# Patient Record
Sex: Male | Born: 1945 | Race: White | Hispanic: No | Marital: Married | Smoking: Former smoker
Health system: Southern US, Community
[De-identification: ages and names within clinical notes are randomized; demographics above are authoritative.]

## PROBLEM LIST (undated history)

## (undated) DIAGNOSIS — Z789 Other specified health status: Secondary | ICD-10-CM

## (undated) DIAGNOSIS — A630 Anogenital (venereal) warts: Secondary | ICD-10-CM

## (undated) HISTORY — DX: Anogenital (venereal) warts: A63.0

## (undated) HISTORY — PX: BACK SURGERY: SHX140

## (undated) HISTORY — PX: VEIN SURGERY: SHX48

## (undated) HISTORY — PX: HERNIA REPAIR: SHX51

---
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
## 1998-07-17 ENCOUNTER — Encounter: Payer: Self-pay | Admitting: Neurological Surgery

## 1998-07-17 ENCOUNTER — Ambulatory Visit (HOSPITAL_COMMUNITY): Admission: RE | Admit: 1998-07-17 | Discharge: 1998-07-17 | Payer: Self-pay | Admitting: Neurological Surgery

## 1998-07-25 ENCOUNTER — Encounter: Payer: Self-pay | Admitting: Neurological Surgery

## 1998-07-26 ENCOUNTER — Inpatient Hospital Stay (HOSPITAL_COMMUNITY): Admission: RE | Admit: 1998-07-26 | Discharge: 1998-07-28 | Payer: Self-pay | Admitting: Neurological Surgery

## 1998-07-26 ENCOUNTER — Encounter: Payer: Self-pay | Admitting: Neurological Surgery

## 1998-09-11 ENCOUNTER — Encounter: Payer: Self-pay | Admitting: Neurological Surgery

## 1998-09-11 ENCOUNTER — Ambulatory Visit (HOSPITAL_COMMUNITY): Admission: RE | Admit: 1998-09-11 | Discharge: 1998-09-11 | Payer: Self-pay | Admitting: Neurological Surgery

## 1999-07-21 ENCOUNTER — Ambulatory Visit (HOSPITAL_COMMUNITY): Admission: RE | Admit: 1999-07-21 | Discharge: 1999-07-21 | Payer: Self-pay | Admitting: Neurological Surgery

## 1999-07-21 ENCOUNTER — Encounter: Payer: Self-pay | Admitting: Neurological Surgery

## 2000-01-14 ENCOUNTER — Inpatient Hospital Stay (HOSPITAL_COMMUNITY): Admission: EM | Admit: 2000-01-14 | Discharge: 2000-01-17 | Payer: Self-pay | Admitting: Emergency Medicine

## 2000-01-15 ENCOUNTER — Encounter: Payer: Self-pay | Admitting: Internal Medicine

## 2000-09-25 ENCOUNTER — Encounter: Payer: Self-pay | Admitting: Neurological Surgery

## 2000-09-25 ENCOUNTER — Encounter: Admission: RE | Admit: 2000-09-25 | Discharge: 2000-09-25 | Payer: Self-pay | Admitting: Neurological Surgery

## 2002-09-06 ENCOUNTER — Encounter: Payer: Self-pay | Admitting: Oncology

## 2002-09-06 ENCOUNTER — Ambulatory Visit (HOSPITAL_COMMUNITY): Admission: RE | Admit: 2002-09-06 | Discharge: 2002-09-06 | Payer: Self-pay | Admitting: Oncology

## 2004-09-06 ENCOUNTER — Ambulatory Visit: Payer: Self-pay | Admitting: Oncology

## 2005-09-05 ENCOUNTER — Ambulatory Visit: Payer: Self-pay | Admitting: Oncology

## 2006-09-16 ENCOUNTER — Ambulatory Visit: Payer: Self-pay | Admitting: Oncology

## 2006-09-21 LAB — CBC WITH DIFFERENTIAL/PLATELET
BASO%: 0.9 % (ref 0.0–2.0)
EOS%: 1.2 % (ref 0.0–7.0)
HCT: 41.3 % (ref 38.7–49.9)
LYMPH%: 24 % (ref 14.0–48.0)
MCH: 31.4 pg (ref 28.0–33.4)
MCHC: 34.7 g/dL (ref 32.0–35.9)
NEUT%: 63.7 % (ref 40.0–75.0)
Platelets: 185 10*3/uL (ref 145–400)
RBC: 4.56 10*6/uL (ref 4.20–5.71)
WBC: 6.4 10*3/uL (ref 4.0–10.0)

## 2006-09-21 LAB — PSA: PSA: 0.78 ng/mL (ref 0.10–4.00)

## 2007-09-15 ENCOUNTER — Ambulatory Visit: Payer: Self-pay | Admitting: Oncology

## 2007-11-19 ENCOUNTER — Emergency Department (HOSPITAL_COMMUNITY): Admission: EM | Admit: 2007-11-19 | Discharge: 2007-11-19 | Payer: Self-pay | Admitting: Emergency Medicine

## 2007-11-21 ENCOUNTER — Emergency Department (HOSPITAL_COMMUNITY): Admission: EM | Admit: 2007-11-21 | Discharge: 2007-11-22 | Payer: Self-pay | Admitting: Emergency Medicine

## 2007-12-07 ENCOUNTER — Ambulatory Visit: Payer: Self-pay | Admitting: Oncology

## 2007-12-07 ENCOUNTER — Encounter: Admission: RE | Admit: 2007-12-07 | Discharge: 2007-12-07 | Payer: Self-pay | Admitting: Neurological Surgery

## 2007-12-07 LAB — CBC WITH DIFFERENTIAL/PLATELET
EOS%: 0.8 % (ref 0.0–7.0)
Eosinophils Absolute: 0.1 10*3/uL (ref 0.0–0.5)
LYMPH%: 15.8 % (ref 14.0–48.0)
MCH: 31.3 pg (ref 28.0–33.4)
MCV: 90.8 fL (ref 81.6–98.0)
MONO%: 6.3 % (ref 0.0–13.0)
NEUT#: 9.2 10*3/uL — ABNORMAL HIGH (ref 1.5–6.5)
Platelets: 159 10*3/uL (ref 145–400)
RBC: 4.83 10*6/uL (ref 4.20–5.71)
RDW: 15.8 % — ABNORMAL HIGH (ref 11.2–14.6)

## 2007-12-07 LAB — PSA: PSA: 1.03 ng/mL (ref 0.10–4.00)

## 2007-12-16 ENCOUNTER — Ambulatory Visit (HOSPITAL_COMMUNITY): Admission: RE | Admit: 2007-12-16 | Discharge: 2007-12-16 | Payer: Self-pay | Admitting: Neurological Surgery

## 2008-01-13 ENCOUNTER — Inpatient Hospital Stay (HOSPITAL_COMMUNITY): Admission: RE | Admit: 2008-01-13 | Discharge: 2008-01-22 | Payer: Self-pay | Admitting: Neurological Surgery

## 2008-05-24 ENCOUNTER — Encounter: Admission: RE | Admit: 2008-05-24 | Discharge: 2008-05-24 | Payer: Self-pay | Admitting: Neurological Surgery

## 2008-11-20 ENCOUNTER — Ambulatory Visit (HOSPITAL_COMMUNITY): Admission: RE | Admit: 2008-11-20 | Discharge: 2008-11-20 | Payer: Self-pay | Admitting: Neurological Surgery

## 2008-12-01 ENCOUNTER — Ambulatory Visit: Payer: Self-pay | Admitting: Oncology

## 2010-02-04 IMAGING — CT CT L SPINE W/ CM
3 of 15 series · 7 of 33 positions shown, 8 images · IV contrast (omnipaque)
Comparison: MRI lumbar spine 12/07/2007
COMPARISON: Lumbar MRI 12/07/2007

CLINICAL DATA: Low back pain with leg pain.

]
MYELOGRAM LUMBAR
TECHNIQUE: ] Following injection of intrathecal Omnipaque contrast,
spine imaging in multiple projections was performed using
fluoroscopy.
TECHNIQUE: CT imaging of the lumbar spine was performed after
intrathecal contrast administration.  Multiplanar CT image
reconstructions were also generated.

[Series 2: l-spine helical · axial · 0.27mm/px · z∈[-406,-173]mm · 2 of 94 slices shown, 3 images]
[im 1/94  soft-tissue]
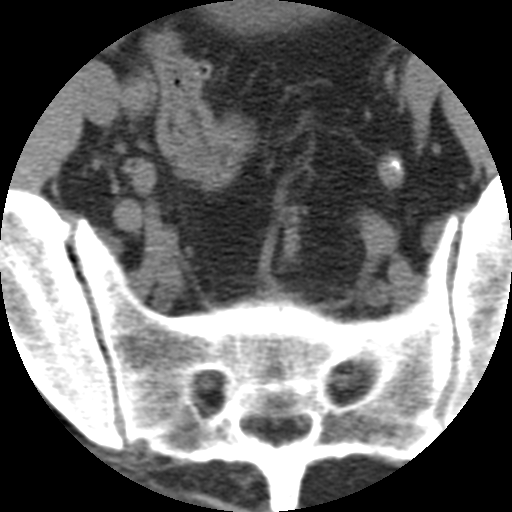
[im 1/94  bone]
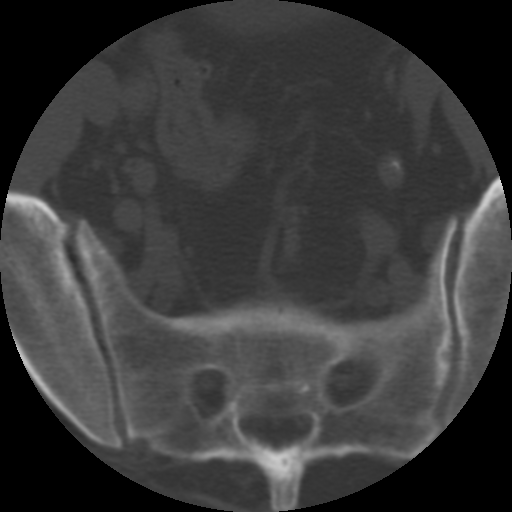
[im 94/94  bone]
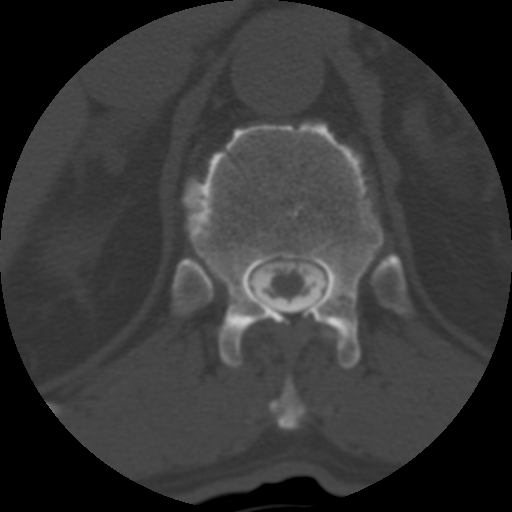

[Series 103: reformatted · sagittal · 0.45mm/px · 2 of 56 slices shown (1 of 2)]
[im 19/56  bone]
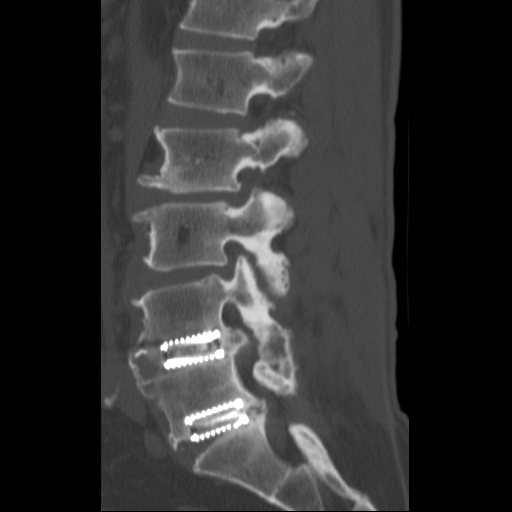
[im 37/56  bone]
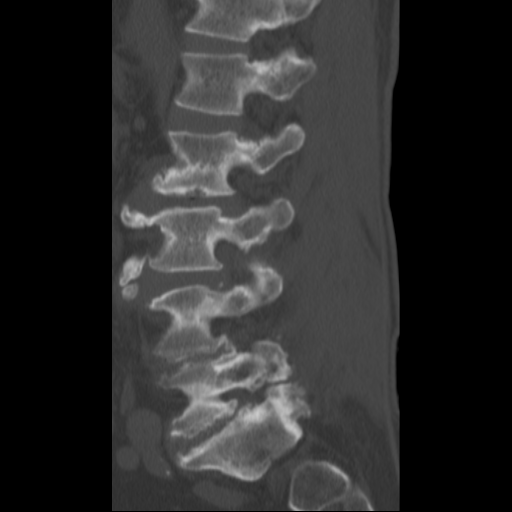

[Series 105: reformatted · coronal · 0.47mm/px · 3 of 62 slices shown (2 of 2)]
[im 13/62  bone]
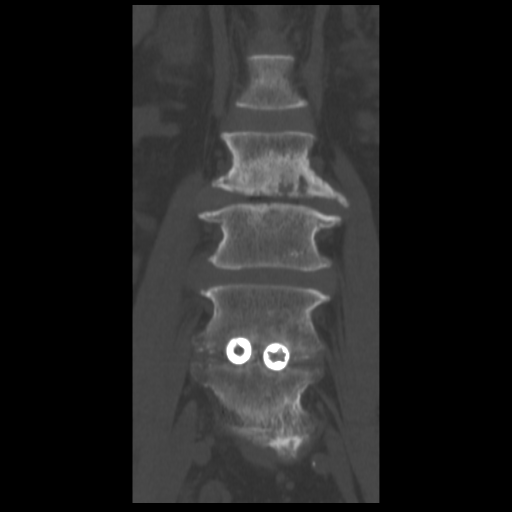
[im 25/62  bone]
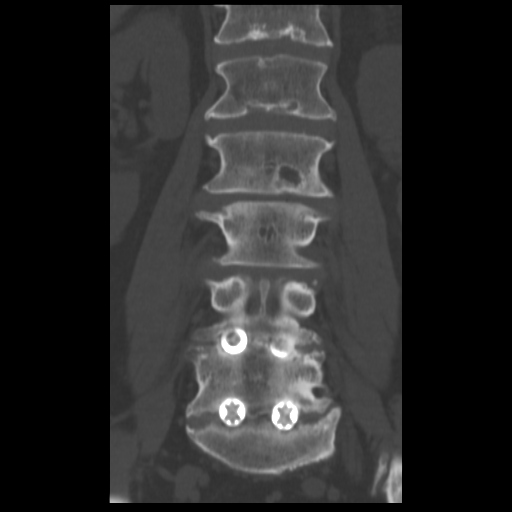
[im 37/62  bone]
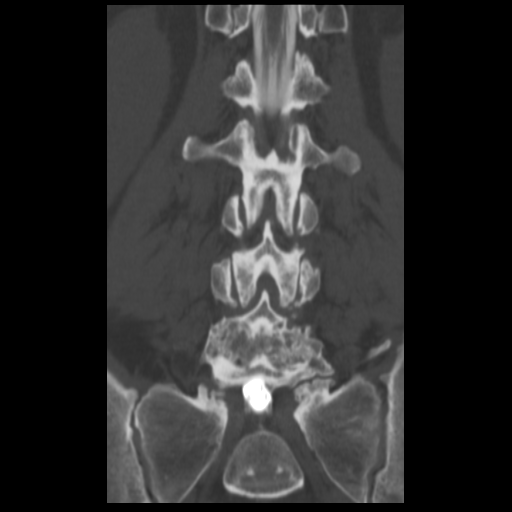

[7 of 33 positions shown; findings below may reference images not displayed]

FINDINGS: Lumbar puncture injection of Omnipaque contrast was
performed by Dr. Escobar.  There has been Ray cage fusion at L4-5
and L5, S1.

There is moderate spinal stenosis at L2-3 with disc protrusion and
vertebral endplate spurring.

There is severe spinal stenosis at L3-4 with retrolisthesis of L3-
L4 vertebral spurring.

There is no significant spinal stenosis at L4-5 or L5-S1.  Ray
cages are in good position.  No vertebral body fractures present.
IMPRESSION: Satisfactory bilateral Ray cage fusion at L4-5 and L5-S1

Moderate spinal stenosis at L2-3 and severe spinal stenosis at L3-
4.

CT MYELOGRAPHY LUMBAR SPINE
FINDINGS: There is mild retrolisthesis of L3 and L4 measuring
approximate 3 mm.  No fracture is identified.  The conus medullaris
is normal.

T12-L1 mild disc bulging

L1-2:  Mild to moderate disc bulging and mild facet arthropathy.
There is mild spinal stenosis.

L2-3:  There is advanced disc degeneration with disc space
narrowing and diffuse bulging of the disc.  Extensive endplate
degenerative change is seen with subchondral cysts and sclerosis in
the vertebral bodies.  There is moderate facet and ligamentum
flavum hypertrophy and there is moderate to severe spinal stenosis.
Lateral recesses are narrowed bilaterally

L3-4:  There is diffuse bulging of the disc.  There is moderate to
advanced facet and ligamentum flavum hypertrophy.  There is severe
spinal stenosis and there is significant lateral recess stenosis
bilaterally.

L4-5:  Bilateral ray cages are in good position.  This appears to
be a solid bony fusion.  No significant spinal stenosis.

L5-S1:  Bilateral ray cages are in good position.  There is
pseudoarthrosis at L5-S1 no significant spinal stenosis is
identified.
IMPRESSION: Moderately severe spinal stenosis at L2-3 and severe spinal
stenosis at L3-4.

Ray cage fusion at L4-5 without spinal stenosis

Ray cage fusion at L5-S1 with pseudoarthrosis.  No significant
spinal stenosis.

## 2010-11-06 LAB — CBC
HCT: 40.3 % (ref 39.0–52.0)
Hemoglobin: 13.6 g/dL (ref 13.0–17.0)
MCHC: 33.6 g/dL (ref 30.0–36.0)
MCV: 86.7 fL (ref 78.0–100.0)
RBC: 4.65 MIL/uL (ref 4.22–5.81)
RDW: 21.8 % — ABNORMAL HIGH (ref 11.5–15.5)

## 2010-12-10 NOTE — Discharge Summary (Signed)
NAME:  Grant Clark, Grant Clark              ACCOUNT NO.:  [id number]   MEDICAL RECORD NO.:  [id number]          PATIENT TYPE:  AMB   LOCATION:  SDS                          FACILITY:  [facility]   PHYSICIAN:  Stefani Dama, M.D.  DATE OF BIRTH:  05/05/1946   DATE OF ADMISSION:  01/13/2008  DATE OF DISCHARGE:  01/22/2008                               DISCHARGE SUMMARY   ADMITTING DIAGNOSIS:  Lumbar spondylosis with stenosis L2-L3 and L3-L4,  status post arthrodesis L4 through sacrum.   DISCHARGE DIAGNOSIS:  Lumbar spondylosis with stenosis L2-L3 and L3-L4,  status post arthrodesis L4 though sacrum.   MAJOR OPERATION:  Decompression L2-L3 and L3-L4 with total laminectomy,  bilateral diskectomy L2-L3 and L3-L4, posterior lumbar interbody fusion,  and fixation L2 to L4 with pedicle screws on January 13, 2008 and then  revision of lumbar fusion on January 18, 2008, revision surgery on January 18, 2008 secondary to loss of fixation of L4 with fixation from L2 to L5.   HOSPITAL COURSE:  [name] is a 65 year old individual  who has had significant problems with back pain, bilateral lower  extremity pain, lumbar spinal stenosis.  He has advanced stenotic  disease at L2-L3 and L3-L4.  He was taken to the operating room on January 13, 2008, where he underwent surgical decompression at L2-L3 and L3-L4  with posterior lumbar interbody arthrodesis is being performed along  with segmental fixation.  The patient did well until that evening of  28th when he developed significant pain in his back the next day he was  evaluated with a CT scan of lumbar spine which demonstrated that he had  a fracture through the pedicle bilaterally at the L4 vertebrae.  I was  felt that he had loss of fixation at the L4 vertebrae and the following  day, he was taken to the operating room and underwent surgical revision.  The fracture was noted and screws were removed particularly from the  right side where he had worse  of his pain and fixation was then  performed down to L5.  The patient has done well since that time and has  had progressive diminish in his degree of pain.  His incision is clean  and dry and at the current time the patient is ambulatory.  He did have  some postoperative constipation this has been resolved with use of  laxatives.   CONDITION ON DISCHARGE:  Improving.   DISCHARGE MEDICATIONS:  The patient was given prescriptions for Percocet  #60 without refills, Valium 5 mg #40 without refills, and Robaxin #40  with 3 refills.  He will be seen in the office in 3 weeks' time.      Stefani Dama, M.D.  Electronically Signed     HJE/MEDQ  D:  01/21/2008  T:  01/22/2008  Job:  [id number]

## 2010-12-10 NOTE — Op Note (Signed)
NAME:  LATHEN, SEAL NO.:  [id number]   MEDICAL RECORD NO.:  [id number]          PATIENT TYPE:  INP   LOCATION:  3104                         FACILITY:  [facility]   PHYSICIAN:  Stefani Dama, M.D.  DATE OF BIRTH:  Feb 14, 1946   DATE OF PROCEDURE:  01/13/2008  DATE OF DISCHARGE:                               OPERATIVE REPORT   PREOPERATIVE DIAGNOSES:  1. Lumbar spondylosis and stenosis, L2-3 and L3-4, with lumbar      radiculopathy.  2. Neurogenic claudication.  3. Status post arthrodesis, L4 to sacrum.   POSTOPERATIVE DIAGNOSES:  1. Lumbar spondylosis and stenosis, L2-3 and L3-4 with lumbar      radiculopathy.  2. Neurogenic claudication.  3. Status post arthrodesis, L4 to sacrum.   PROCEDURES:  1. Laminotomies bilaterally at L2-3 and L3-4 with decompression of L2,      L3, and L4 nerve roots bilaterally.  2. Posterior lumbar interbody fusion with PEEK bone spacers and local      autograft and allograft.  3. Total diskectomy, L2-3 and L3-4.  4. Segmental fixation, L2 to L4.  5. Posterolateral arthrodesis with local autograft and allograft.   SURGEON:  Stefani Dama, MD   FIRST ASSISTANT:  Danae Orleans. Venetia Maxon, MD   ANESTHESIA:  General endotracheal.   INDICATIONS:  Dakari Cregger is a 65 year old individual who 12  years ago underwent surgical decompression and arthrodesis at L4-5 and  L5-S1 for severe spondylitic stenosis.  He did well for a long number of  years, but in the past year he has developed recurrent symptoms of  neurogenic claudication with lumbar radiculopathy.  He is now found to  have severe spondylitic stenosis at L2-3 and L3-4.  He has been advised  regarding surgical decompression and fusion.   PROCEDURE IN DETAIL:  The patient was brought to the operating room  supine on the stretcher.  After smooth induction of general endotracheal  anesthesia, he was turned prone.  The back was prepped with alcohol and  DuraPrep and draped  in a sterile fashion.  Midline incision was created  and carried down to the lumbodorsal fascia which was opened on either  side of the midline to expose the spinous processes of L2, L3, and L4,  which were identified positively with a radiograph.  Then, the  interlaminar spaces at L2-3 and L3-4 were cleared, and a large  laminotomy involving the inferior marginal of the lamina all the way up  to and including the entirety of the facet joint at L2 and L3 was  performed; this was performed bilaterally.  Then, the yellow ligament,  which was thickened and redundant, was taken up and a medial facetectomy  was also performed to expose the entirety of the common dural tube up to  and including the takeoff of the L2 nerve root superiorly and the L3  nerve root inferiorly at the L2-3 level and then at the L3-4 level the  L3 and L4 nerve roots were completely isolated and skeletonized and  decompressed.  Once this was accomplished, bilateral diskectomies were  then performed at L2-3  and L3-4 for using a combination of curettes and  rongeurs.  The entirety of the disk space was evacuated fully and then  the endplates were curetted smooth until good bony bleeding was  encountered.  The interbody spacers were then sized.  It was felt that  10-mm spacers would fit well at L3-4 and 8-mm spacers would fit well at  L2-3.  Spacers were then prepared by filling them with autograft,  allograft, and strips of Infuse.  Each interspace was then packed with  autograft, allograft, and strips of Infuse also and then the cages were  placed at L3-4 first and then at L2-3.  Once these were secured, lateral  gutters were then decompressed and skeletonized for posterolateral  grafting.  Then with fluoroscopic guidance, pedicle screws were placed  at L2, L3, and L4, each screw being placed after a probe was passed  using fluoroscopic guidance, removing the probe, tapping each hole, and  then sounding the hole to make  sure there was no lateral cutout.  The  screws used were 6.5 x 45 mm screws throughout.  Once the screws were  placed and their positions verified in AP and lateral projections with  fluoroscopy, fluoro unit was removed and 70-mm precontoured rods were  used to connect between the pedicle screws from L2 to L4.  These were  torqued down in a neutral position.  Posterolateral graft was then  packed into the lateral gutters between L2 and L4.  Once this was  accomplished, the retractors were removed.  The area was inspected.  Care was taken to make sure that the L2, L3, and the L4 nerve roots were  well decompressed as had originally being accomplished, and once this  was finished then the lumbodorsal fascia was reapproximated with #1  Vicryl in interrupted fashion, 2-0 Vicryl in subcutaneous tissues, and 3-  0 Vicryl subcuticularly.  Dry sterile dressing was placed on the skin.  Blood loss was estimated at 750 mL, and 250 mL of Cell Saver blood was  returned to the patient.  The patient tolerated the procedure well.      Stefani Dama, M.D.  Electronically Signed     HJE/MEDQ  D:  01/13/2008  T:  01/14/2008  Job:  [id number]

## 2010-12-10 NOTE — Op Note (Signed)
NAME:  Grant Clark, Grant Clark              ACCOUNT NO.:  [id number]   MEDICAL RECORD NO.:  [id number]          PATIENT TYPE:  OIB   LOCATION:  3524                         FACILITY:  [facility]   PHYSICIAN:  Stefani Dama, M.D.  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   POSTOPERATIVE DIAGNOSIS:  Right ulnar neuropathy secondary to  compression at the elbow and Guyon canal.   OPERATION:  Right ulnar nerve decompression at the cubital tunnel and  right ulnar nerve decompression at Guyon canal.   SURGEON:  Stefani Dama, MD   ANESTHESIA:  General with lidocaine 1% with epinephrine mixed 50:50 with  0.50% Marcaine for a total of 20 mL.   INDICATIONS:  Grant Clark is a 65 year old individual who has had progressive  loss of ulnar functioning muscles with weakness and atrophy in the  intrinsic muscles of the hand.  He has pain in the ulnar distribution.  Efforts to treat this conservatively with some splinting and the passage  of time and some behavioral changes with the use of the right elbow have  not yielded any significant relief.  Electrical studies demonstrate  clearly that he has a marked swelling across the region of the elbow but  he has a second area of swelling through the region of Guyon canal is  though undergoing decompression of the ulnar nerve.   PROCEDURE:  The patient was brought to the operating room and placed on  op table in supine position.  After a smooth induction of general  endotracheal anesthesia, the right hand, forearm and arm was prepped  with alcohol and DuraPrep and draped in a sterile fashion using a  stockinette technique.  Stockinette was opened over the area of the  wrist and over the area of the elbow.  The planned surgical sites were  then drawn out with a  Z-plasty type incision over the area of the wrist.  This area was infiltrated with 10 mL of lidocaine mixed with 0.50%  Marcaine and 1:200,000 epinephrine.  The incision across the elbow was  infiltrated with a similar mixture for a total volume of 20 mL between  both incision areas.  The wrist was opened first with the incision being  opened in Z fashion across the wrist crease.  Dissection was taken down  through the superficial tissues.  The lateral aspect of the flexor carpi  ulnaris tendon was identified and medial to this and the groove between  the pisiform and the hamate was identified the ulnar nerve along with  the ulnar artery.  The ulnar nerve was noted to have a tight syncytium  tissue just above the right knee area of the groove between the hamate  and the pisiform bone.  This area was dissected free and the nerve was  freed.  The distal branch traveling deep into the palmar fascia which  was the motor branch was then decompressed in  a similar fashion.  Minimal bleeding was encountered during this dissection.  Several small  vessels were cauterized with the microbipolar cautery.  The proximal  aspect of the nerve across the wrist crease itself did not appear to be  very compressed.  The superficial fascia over this was released to allow  good visualization of the nerve.  Once the decompression was completed  two 3-0 Vicryl sutures were used on the fascia to reapproximate the skin  and then interrupted vertical mattress nylon sutures were placed to  close the skin.  A dry, fluffy dressing was applied to this area.  Second portion of the procedure was then started by opening a 10-cm  incision just ventral to the medial aspect of the elbow.  This was  dissected down through the superficial fascia and then in the ulnar  groove.  Thickened dense fascia was opened proximally to identify the  incoming ulnar nerve from between the muscular groove.  The nerve was  identified and then  the distal fascia was dissected to decompress the  nerve as it traveled across the cubital tunnel and then distally there  was noted to be a thick  syncytium of tissue just as the nerve entered  between the two heads of the flexor carpi ulnaris.  This fascia was  opened and allowed for good decompression of the nerve.  This was a most  significant area of compromise for the nerve itself.  Once this area was  completely decompressed and the decompression was obtained in three-  quarter circular fashion around the nerve it was decided that no further  manipulation of the tissues would be required.  Hemostasis in the  muscular branches to the flexor carpi ulnaris muscle was obtained  meticulously.  Once this was achieved and several subcutaneous fascial  sutures reapproximated the skin and a 4-0 Monocryl suture was used to  close the subcuticular skin.  Dry sterile dressings were then applied.  The ulnar incision was closed with a Dermabond layer and then a fluffy  dressing was placed over the elbow and over the wrist.  The patient  tolerated the procedure well.  Blood loss was nil.      Stefani Dama, M.D.  Electronically Signed     HJE/MEDQ  D:  11/20/2008  T:  11/20/2008  Job:  [id number]

## 2010-12-10 NOTE — Op Note (Signed)
NAME:  [name], [name] NO.:  [id number]   MEDICAL RECORD NO.:  [id number]          PATIENT TYPE:  INP   LOCATION:  3113                         FACILITY:  [facility]   PHYSICIAN:  Stefani Dama, M.D.  DATE OF BIRTH:  1945-11-09   DATE OF PROCEDURE:  01/18/2008  DATE OF DISCHARGE:                               OPERATIVE REPORT   PREOPERATIVE DIAGNOSES:  1. Loss of fixation L2-L4 fusion with pedicle fracture of L4.  2. Right lower extremity pain.  3. Right lower extremity weakness.   POSTOPERATIVE DIAGNOSES:  1. Loss of fixation L2-L4 fusion with pedicle fracture of L4.  2. Right lower extremity pain.  3. Right lower extremity weakness.   PROCEDURE FOR SURGERY:  1. Revision of arthrodesis with fixation L2-L5.  2. Decompression of L2, L3, and L4 nerve roots on the right.   SURGEON:  Stefani Dama, MD   ANESTHESIA:  General endotracheal.   INDICATIONS:  Grant Clark is a 65 year old individual who on the 18th  underwent surgical decompression arthrodesis of L2-L4.  He did well for  the first 3 days postoperatively but then rather acutely.  On the  evening of the 21st, he developed substantial back and lower extremity  pain.  He was complaining bitterly of pain in the region of the right  buttock and right hip and was found on the CT scan performed on the 22nd  to have fractured pedicle on the right side quite severely and on the  left side modestly with the fracture and he was found also to have some  question of extruded graft in the site of L2-L3 after careful  consideration of in fact surgical root revision of this process.   PROCEDURE:  The patient was brought to the operating room supine on the  stretcher. After smooth induction of general endotracheal anesthesia,  Foley catheter was placed.  He was turned in a prone position.  The back  was prepped with alcohol and DuraPrep and draped in a sterile fashion.  A firmly close midline incision was reopened  and sutures were carefully  removed.  The wound was reopened very carefully.  Some epidural blood  was encountered and the fascia was opened and epidural blood was  encountered in the deep portion of the wound.  Sample of this was sent  for stat Gram stain and also culture.  The area was then evaluated and  some extruded bone material was noted on the lateral gutters and this  was really quite minimal.  The L2 and the L3 nerve roots were  decompressed with some epidural clot and the L4 nerve root was similarly  found to be modestly involve.  There was noted be an obvious fracture of  the L4 pedicle on the right.  Screw heads were loosened on all six  screws and the rods were removed.  Each of the screws was then felt to  be tight and clearly the right-sided L4 screw was loose.  The screw was  removed.  The left-sided L4 screw was felt to be tight.  At this point,  it  was decided that ultimately we need fixation at L5 and then a right-  sided screw was placed in L5 measuring 6.5 x 45 mm.  After tightening  the right side including some compression, left side was reassessed and  was felt that left-sided L5 screw should also be placed.  L4 screw was  left in place using fluoroscopic guidance and the L5 screw was placed  and this was tapped to 6.5 mm tap and 6.5 x 45 mm screw was placed in  the vertebral body.  On the right side, a 100-mm rod was used.  On the  left side, a 90-mm rod was used.  These were tightened down to the final  locking position and AP and lateral radiograph of the pain to check  position of the hardware.  This was found to be adequate.  Care was  taken to make sure that hemostasis was obtained in the all lateral  gutters.  The construct was closed in a little bit of compression on the  inferior aspect of the screws and with this the area was irrigated  copiously with antibiotic irrigating solution.  Lumbodorsal fascia was  closed with #1 Vicryl in interrupted fashion,  2-0 Vicryl.  The  subcutaneous tissues and 3-0 Vicryl subcuticularly.  The patient  tolerated the procedure well and was returned to recovery room in stable  condition.  Blood loss estimated 150 mL.      Stefani Dama, M.D.  Electronically Signed     HJE/MEDQ  D:  01/18/2008  T:  01/20/2008  Job:  [id number]

## 2010-12-13 NOTE — Consult Note (Signed)
[place]  Patient:    Grant Clark, Grant Clark                     MRN: [id number] Proc. Date: 01/15/00 Adm. Date:  [id number] Disc. Date: [id number] Attending:  Phifer, Harriett Sine Welcome Dictator:   Larey Dresser, N.P. CC:         Dolan Amen, M.D.             Eric A. Donnald Garre, M.D.                          Consultation Report  REASON FOR CONSULT:  Pancytopenia.  HISTORY OF PRESENT ILLNESS:  Mr. Grant Clark is a 65 year old male who was transferred from [facility] from [facility] in [place] on January 14, 2000, after he was found to be pancytopenic upon workup for a syncopal episode at work on January 13, 2000.  The [place] in [place], but was working in [place] at the time of the syncopal episode.  His lab data at [facility] on January 13, 2000, revealed a hemoglobin of 5.7, WBC 2.3, platelet count 37,000, MCV 133, LDH 5869, reticulocyte count 0.4.  The patient received two units of packed red cells prior to his transfer to [facility].  His lab data upon arrival at [facility] revealed a hemoglobin of 7.4, WBC 1.9, platelet count 37,000, MCV 107, LDH 4711.  The patient received an additional two units of packed red cells on January 15, 2000.  Mr. Taniguchi had complaints of weakness and diminished appetite for three to four weeks.  He denied any fevers, chills, sweats, weight loss, shortness of breath or chest pain.  No recent infections.  He does report numbness to his hands and legs extending from the waist down which he reports has been present to some degree since late 1999/early 2000; but this has progressively worsened to the point where he is experiencing great difficulty with ambulation.  PAST MEDICAL HISTORY: 1. Status post anterior cervical diskectomy and arthrodesis at C4-5, C5-02 July 1998. 2. Status post lumbar laminectomy, diskectomy, L4-5, L5-S1, November 1998.  CURRENT  MEDICATIONS:  Occasional Aleve.  ALLERGIES:  No known drug allergies.  FAMILY HISTORY:  Mother deceased at age 69 with diabetes mellitus.  Father alive, age 67, prostate cancer, B12 deficiency maintained on monthly injections.  Brother, age 73, alive and well.  Sister, age 32, alive and well. Brother, age 37, alive and well.  Brother, age 61, alive and well.  SOCIAL HISTORY:  The patient lives in [place], [place], with his wife Pattricia Boss.  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  He was previously employed with the same company in maintenance. He has one son who is 78 years old and healthy.  He has no known exposure to any chemicals, toxic substances or radiation.  He does report a positive tobacco history at less than one pack per day x 25 years - active; ETOH occasional; no illegal drug use.  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  REVIEW OF SYSTEMS:  General:  No weight loss or fatigue.  He does report being "weak."  His appetite has been diminished.  He denies any fever, chills or sweats.  He denies any pain.  He has had no headaches, vision changes or hearing problems.  He  denies any mouth sores or sore throat.  No recent upper respiratory tract infections.  He reports some limited mobility of his neck since surgery.  No swollen lymph glands.  He denies any shortness of breath, but does report a cough which is dry.  He denies hemoptysis.  He denies any chest pain.  He has had no changes in bowel habits.  No rectal bleeding, melena, abdominal pain, nausea or vomiting, abdominal fullness.  He denies any hematuria, dysuria or nocturia.  He does report some right lower extremity weakness.  He has numbness to his hands and "waist down".  He has difficulty with ambulation secondary to the numbness.  He denies any blurred or double vision.  Heme:  No bruising or easy bleeding.  PHYSICAL EXAMINATION:  VITAL  SIGNS:  Temperature 97.9, heart rate 68, respirations 20, blood pressure 109/61, oxygen saturation is 95% on two liters.  GENERAL:  This is a Caucasian male in no acute distress.  HEENT:  Normocephalic, atraumatic.  Pupils are equal, round and reactive to light; extraocular movements are intact; sclerae are anicteric; mild conjunctival pallor.  Nares are patent bilaterally.  Oropharynx is clear.  LYMPH:  No cervical, supraclavicular, axillary or inguinal adenopathy.  LUNGS:  With rales to the right base and diminished at bilateral bases.  CARDIOVASCULAR:  Regular rate and rhythm with no murmur.  EXTREMITIES:  There is trace pedal edema.  Distal pulses are intact.  Without cyanosis or clubbing.  ABDOMEN:  Soft and nontender.  Bowel sounds are active.  No masses or hepatosplenomegaly.  NEUROLOGIC:  He is alert and oriented x 3.  He does follow commands.  His muscle strength is 5/5 although slightly diminished at the right quadriceps. He does have diminished proprioception as well as vibratory sense to the lower extremities.  SKIN:  Without jaundice.  LABORATORY DATA:  On June 19, 1997, MCV 103.0, hemoglobin 16.6, platelets 179,000.  On July 25, 1998, MCV 119.3, hemoglobin 15.5, platelets 186,000. On January 13, 2000, MCV 133.2, hemoglobin 5.7, platelets 37,000, sodium 140, potassium 3.7, BUN 11, creatinine 1.0, glucose 134, total bilirubin 1.0, alkaline phosphatase 45, SGOT 95, SGPT 56, total protein 5.0, albumin 3.3, calcium 8.5, PTT 32, PT/INR 16.0/1.5.  Labs faxed from [facility] which were obtained on January 13, 2000, revealed a serum iron of 186, TIBC 231, percent saturation 80.5, vitamin B12 84, folic acid 13.1, reticulocyte count 0.4, LDH 5.69.  A review of the bone marrow biopsy revealed erythroid hyperplasia with marked nucleic/cytoplasmic, a synchronicity; giant metamyelocytes.  IMPRESSION: 1. Severe B12 deficiency with hematological and neurological  manifestation.    Suggestions:     a. Therapeutic - B12 1 mg IM daily x 7, then every week for six months,       then every month for lifetime; KCl 40 mEq p.o. b.i.d. for three to four       days very close monitoring of the serum potassium as hypokalemia is a       potential occurrence; would also give folic acid 1 mg by vein although       he is not deficient.    b. 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       Dr. Nevada Crane discussed his finding with Mr. Mchan, his wife and their       son at length.  He will follow up with the patient in his office after  discharge. DD:  01/16/00 TD:  01/16/00 Job: [id number]

## 2011-04-24 LAB — TYPE AND SCREEN
ABO/RH(D): A NEG
Antibody Screen: NEGATIVE

## 2011-04-24 LAB — POCT I-STAT 4, (NA,K, GLUC, HGB,HCT)
Hemoglobin: 12.9 — ABNORMAL LOW
Potassium: 3.7

## 2011-04-24 LAB — CBC
HCT: 35.5 — ABNORMAL LOW
Hemoglobin: 13.9
MCV: 94
MCV: 94
Platelets: 146 — ABNORMAL LOW
Platelets: 128 — ABNORMAL LOW
RBC: 4.25
RDW: 17.4 — ABNORMAL HIGH
WBC: 8.7
WBC: 13.5 — ABNORMAL HIGH
WBC: 14.4 — ABNORMAL HIGH

## 2011-04-24 LAB — BASIC METABOLIC PANEL
BUN: 14
Calcium: 9.1
Chloride: 105
Creatinine, Ser: 1.1
Creatinine, Ser: 0.98
GFR calc Af Amer: >60
GFR calc non Af Amer: >60
Glucose, Bld: 90
Potassium: 4.1
Sodium: 138

## 2011-04-24 LAB — GRAM STAIN

## 2011-04-24 LAB — COMPREHENSIVE METABOLIC PANEL
BUN: 15
CO2: 25
Chloride: 106
Creatinine, Ser: 0.88
GFR calc non Af Amer: >60
Total Bilirubin: 0.9

## 2016-08-20 ENCOUNTER — Telehealth: Payer: Self-pay

## 2016-08-20 NOTE — Telephone Encounter (Signed)
Pt's VM said that he will try to call me tomorrow morning between 9-10 AM.

## 2016-08-20 NOTE — Telephone Encounter (Signed)
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 He said that he would call back later to speak with DS. I transferred call to DS VM

## 2016-08-21 ENCOUNTER — Telehealth: Payer: Self-pay

## 2016-08-21 NOTE — Telephone Encounter (Signed)
Triaged pt today.

## 2016-08-21 NOTE — Telephone Encounter (Signed)
Gastroenterology Pre-Procedure Review  Request Date: 08/21/2016 Requesting Physician: Dr. Quintin Alto  PATIENT REVIEW QUESTIONS: The patient responded to the following health history questions as indicated:    Pt's last colonoscopy was done 09/15/2011 by Dr. Britta Mccreedy. Pt had a sigmoid colon tubular adenoma and a rectal tubular adenoma.   1. Diabetes Melitis: no 2. Joint replacements in the past 12 months: no 3. Major health problems in the past 3 months: no 4. Has an artificial valve or MVP: no 5. Has a defibrillator: no 6. Has been advised in past to take antibiotics in advance of a procedure like teeth cleaning: no 7. Family history of colon cancer: no  8. Alcohol Use: Maybe a couple of beers a week 9. History of sleep apnea: no  10. History of coronary artery or other vascular stents placed within the last 12 months: no    MEDICATIONS & ALLERGIES:    Patient reports the following regarding taking any blood thinners:   Plavix? no Aspirin? no Coumadin? no Brilinta? no Xarelto? no Eliquis? no Pradaxa? no Savaysa? no Effient? no  Patient confirms/reports the following medications:  Current Outpatient Prescriptions  Medication Sig Dispense Refill  . NON FORMULARY Fish Oil   Not sure the mg/  Takes 2 tablets daily     No current facility-administered medications for this visit.     Patient confirms/reports the following allergies:  No Known Allergies  No orders of the defined types were placed in this encounter.   AUTHORIZATION INFORMATION Primary Insurance:  ID #:  Group #:  Pre-Cert / Auth required:  Pre-Cert / Auth #:   Secondary Insurance:   ID #:   Group #:  Pre-Cert / Auth required:  Pre-Cert / Auth #:   SCHEDULE INFORMATION: Procedure has been scheduled as follows:  Date: 09/15/2016                Time:  9:30 AM Location: [facility] Short Stay  This Gastroenterology Pre-Precedure Review Form is being routed to the following provider(s): Barney Drain,  MD

## 2016-08-22 NOTE — Telephone Encounter (Signed)

## 2016-08-25 MED ORDER — NA SULFATE-K SULFATE-MG SULF 17.5-3.13-1.6 GM/177ML PO SOLN: 1.0000 | ORAL | 0 refills | Status: DC

## 2016-08-25 NOTE — Telephone Encounter (Signed)
Rx sent to the pharmacy and instructions mailed to pt.  

## 2016-08-28 DIAGNOSIS — A630 Anogenital (venereal) warts: Secondary | ICD-10-CM

## 2016-08-28 HISTORY — DX: Anogenital (venereal) warts: A63.0

## 2016-09-01 ENCOUNTER — Other Ambulatory Visit: Payer: Self-pay

## 2016-09-01 DIAGNOSIS — D126 Benign neoplasm of colon, unspecified: Secondary | ICD-10-CM

## 2016-09-01 NOTE — Telephone Encounter (Signed)
Left the message on the phone and told them to return call and let me know they got the message.

## 2016-09-01 NOTE — Telephone Encounter (Signed)
Pt received his instructions in the mail. His wife called to inform us that he DOES TAKE IRON DAILY. I told her to have him HOLD IRON FOR 7 DAYS PRIOR TO PROCEDURE. PT had only told me about the fish oil.  I have updated the chart per his wife with the OTC meds, allergy, multi vit and the iron and the Vit b12 injection.  Forwarding to Dr. Oneida Alar for [place].

## 2016-09-01 NOTE — Telephone Encounter (Signed)
PLEASE CALL PT. 7 days BEFORE ENDOSCOPY He should hold the Fish Oil as well.

## 2016-09-02 NOTE — Telephone Encounter (Signed)
Pt is aware.  

## 2016-09-08 NOTE — Telephone Encounter (Signed)
Pt's wife, Raynelle Highland, called to confirm the meds he is to hold prior to procedure. She is aware of the Iron and the Fish Oil to hold 7 days prior to procedure.

## 2016-09-11 ENCOUNTER — Telehealth: Payer: Self-pay

## 2016-09-11 NOTE — Telephone Encounter (Signed)
Per Madie Reno PA# [id number] for the colonoscopy as out patient.

## 2016-09-15 ENCOUNTER — Encounter (HOSPITAL_COMMUNITY)
Admission: RE | Disposition: A | Discharge status: Home Health | Payer: Self-pay | Source: Ambulatory Visit | Attending: Gastroenterology

## 2016-09-15 ENCOUNTER — Encounter (HOSPITAL_COMMUNITY): Payer: Self-pay

## 2016-09-15 ENCOUNTER — Ambulatory Visit (HOSPITAL_COMMUNITY)
Admission: RE | Admit: 2016-09-15 | Discharge: 2016-09-15 | Disposition: A | Discharge status: Home Health | Payer: 59 | Source: Ambulatory Visit | Attending: Gastroenterology | Admitting: Gastroenterology

## 2016-09-15 DIAGNOSIS — Z1211 Encounter for screening for malignant neoplasm of colon: Secondary | ICD-10-CM | POA: Diagnosis not present

## 2016-09-15 DIAGNOSIS — K644 Residual hemorrhoidal skin tags: Secondary | ICD-10-CM | POA: Diagnosis not present

## 2016-09-15 DIAGNOSIS — Z87891 Personal history of nicotine dependence: Secondary | ICD-10-CM | POA: Insufficient documentation

## 2016-09-15 DIAGNOSIS — Z1212 Encounter for screening for malignant neoplasm of rectum: Secondary | ICD-10-CM

## 2016-09-15 DIAGNOSIS — D490 Neoplasm of unspecified behavior of digestive system: Secondary | ICD-10-CM

## 2016-09-15 DIAGNOSIS — K5730 Diverticulosis of large intestine without perforation or abscess without bleeding: Secondary | ICD-10-CM | POA: Diagnosis not present

## 2016-09-15 DIAGNOSIS — Z79899 Other long term (current) drug therapy: Secondary | ICD-10-CM | POA: Diagnosis not present

## 2016-09-15 DIAGNOSIS — D126 Benign neoplasm of colon, unspecified: Secondary | ICD-10-CM

## 2016-09-15 DIAGNOSIS — A630 Anogenital (venereal) warts: Secondary | ICD-10-CM | POA: Diagnosis not present

## 2016-09-15 HISTORY — PX: COLONOSCOPY: SHX5424

## 2016-09-15 HISTORY — DX: Other specified health status: Z78.9

## 2016-09-15 SURGERY — COLONOSCOPY
Anesthesia: Moderate Sedation

## 2016-09-15 MED ORDER — MEPERIDINE HCL 100 MG/ML IJ SOLN
INTRAMUSCULAR | Status: DC | PRN
  Administered 2016-09-15 (×2): 25 mg via INTRAVENOUS

## 2016-09-15 MED ORDER — MIDAZOLAM HCL 5 MG/5ML IJ SOLN
INTRAMUSCULAR | Status: AC
  Filled 2016-09-15: qty 10

## 2016-09-15 MED ORDER — STERILE WATER FOR IRRIGATION IR SOLN
Status: DC | PRN
  Administered 2016-09-15: 100 mL

## 2016-09-15 MED ORDER — SODIUM CHLORIDE 0.9 % IV SOLN
INTRAVENOUS | Status: DC
  Administered 2016-09-15: 09:00:00 via INTRAVENOUS

## 2016-09-15 MED ORDER — MIDAZOLAM HCL 5 MG/5ML IJ SOLN
INTRAMUSCULAR | Status: DC | PRN
  Administered 2016-09-15 (×2): 2 mg via INTRAVENOUS

## 2016-09-15 MED ORDER — MEPERIDINE HCL 100 MG/ML IJ SOLN
INTRAMUSCULAR | Status: AC
  Filled 2016-09-15: qty 2

## 2016-09-15 NOTE — H&P (Signed)
Primary Care Physician:  Manon Hilding, MD Primary Gastroenterologist:  Dr. Oneida Alar  Pre-Procedure History & Physical: HPI:  Grant Clark is a 71 y.o. male here for COLON CANCER SCREENING.  Past Medical History:  Diagnosis Date  . Medical history non-contributory     Past Surgical History:  Procedure Laterality Date  . APPENDECTOMY  1963   [facility]  . BACK SURGERY     times 5, [facility]  . HERNIA REPAIR     inguinal hernia, 5 or 6 years back  . VEIN SURGERY Right    [place]    Prior to Admission medications   Medication Sig Start Date End Date Taking? Authorizing Provider  cetirizine (ZYRTEC) 10 MG tablet Take 10 mg by mouth daily.   Yes Historical Provider, MD  Cyanocobalamin (B-12 COMPLIANCE INJECTION) 1000 MCG/ML KIT Inject as directed every 30 (thirty) days.   Yes Historical Provider, MD  ferrous sulfate 325 (65 FE) MG EC tablet Take 325 mg by mouth daily with breakfast.   Yes Historical Provider, MD  Multiple Vitamin (MULTIVITAMIN) tablet Take 1 tablet by mouth daily.   Yes Historical Provider, MD  Na Sulfate-K Sulfate-Mg Sulf (SUPREP BOWEL PREP KIT) 17.5-3.13-1.6 GM/180ML SOLN Take 1 kit by mouth as directed. 08/25/16  Yes Danie Binder, MD  Omega-3 Fatty Acids (FISH OIL) 1000 MG CAPS Take 2 capsules by mouth daily.   Yes Historical Provider, MD    Allergies as of 09/01/2016  . (No Known Allergies)    Family history: no colonCANCER/polyps  Social History   Social History  . Marital status: Married    Spouse name: N/A  . Number of children: N/A  . Years of education: N/A   Occupational History  . Not on file.   Social History Main Topics  . Smoking status: Former Research scientist (life sciences)  . Smokeless tobacco: Never Used  . Alcohol use Yes     Comment: 2 more than 2 a week  . Drug use: No  . Sexual activity: Not on file   Other Topics Concern  . Not on file   Social History Narrative  . No narrative on file    Review of Systems: See HPI, otherwise negative  ROS   Physical Exam: BP 139/79   Pulse 62   Temp 98.2 F (36.8 C) (Oral)   Resp 12   Ht 5' 11"  (1.803 m)   Wt 192 lb (87.1 kg)   SpO2 98%   BMI 26.78 kg/m  General:   Alert,  pleasant and cooperative in NAD Head:  Normocephalic and atraumatic. Neck:  Supple; Lungs:  Clear throughout to auscultation.    Heart:  Regular rate and rhythm. Abdomen:  Soft, nontender and nondistended. Normal bowel sounds, without guarding, and without rebound.   Neurologic:  Alert and  oriented x4;  grossly normal neurologically.  Impression/Plan:     SCREENING  Plan:  1. TCS TODAY. DISCUSSED PROCEDURE, BENEFITS, & RISKS: < 1% chance of medication reaction, bleeding, perforation, or rupture of spleen/liver.

## 2016-09-15 NOTE — Op Note (Signed)
[facility] Patient Name: Grant Clark Procedure Date: 09/15/2016 9:20 AM MRN: [id number] Date of Birth: Sep 08, 1945 Attending MD: Barney Drain , MD CSN: [id number] Age: 71 Admit Type: Outpatient Procedure:                Colonoscopy WITH COLD FORCEPS POLYPECTOMY Indications:              Screening for colorectal malignant neoplasm Providers:                Barney Drain, MD, Rosina Lowenstein, RN, Aram Candela Referring MD:             Manon Hilding MD, MD Medicines:                Meperidine 50 mg IV, Midazolam 4 mg IV Complications:            No immediate complications. Estimated Blood Loss:     Estimated blood loss was minimal. Procedure:                Pre-Anesthesia Assessment:                           - Prior to the procedure, a History and Physical                            was performed, and patient medications and                            allergies were reviewed. The patient's tolerance of                            previous anesthesia was also reviewed. The risks                            and benefits of the procedure and the sedation                            options and risks were discussed with the patient.                            All questions were answered, and informed consent                            was obtained. Prior Anticoagulants: The patient has                            taken no previous anticoagulant or antiplatelet                            agents. ASA Grade Assessment: I - A normal, healthy                            patient. After reviewing the risks and benefits,                            the patient was deemed in satisfactory condition to  undergo the procedure. After obtaining informed                            consent, the colonoscope was passed under direct                            vision. Throughout the procedure, the patient's                            blood pressure, pulse, and oxygen saturations were                            monitored continuously. The [id number]                            scope was introduced through the anus and advanced                            to the the cecum, identified by appendiceal orifice                            and ileocecal valve. The colonoscopy was somewhat                            difficult due to a tortuous colon. Successful                            completion of the procedure was aided by COLOWRAP.                            The patient tolerated the procedure well. The                            quality of the bowel preparation was good. The                            ileocecal valve, appendiceal orifice, and rectum                            were photographed. Scope In: 9:44:45 AM Scope Out: 10:11:40 AM Scope Withdrawal Time: 0 hours 23 minutes 18 seconds  Total Procedure Duration: 0 hours 26 minutes 55 seconds  Findings:      Multiple small and large-mouthed diverticula were found in the       recto-sigmoid colon and sigmoid colon.      A 6 mm polypoid lesion was found at the anus. The lesion was       frond-like/villous. No bleeding was present. This was biopsied with a       cold forceps for histology.      External hemorrhoids were found during retroflexion. The hemorrhoids       were moderate. Impression:               - Diverticulosis in the recto-sigmoid colon and in  the sigmoid colon.                           - Polypoid lesion at the anus. Biopsied AND REMOVED                           - External hemorrhoids. Moderate Sedation:      Moderate (conscious) sedation was administered by the endoscopy nurse       and supervised by the endoscopist. The following parameters were       monitored: oxygen saturation, heart rate, blood pressure, and response       to care. Total physician intraservice time was 37 minutes. Recommendation:           - High fiber diet.                           - Continue  present medications.                           - Await pathology results.                           - Repeat colonoscopy 10-15 YEARS IF THE BENEFITS                            OUTWEIGH THE RISKS for surveillance.                           - Patient has a contact number available for                            emergencies. The signs and symptoms of potential                            delayed complications were discussed with the                            patient. Return to normal activities tomorrow.                            Written discharge instructions were provided to the                            patient. Procedure Code(s):        --- Professional ---                           [phone], Colonoscopy, flexible; with biopsy, single                            or multiple                           99152, Moderate sedation services provided by the                            same  physician or other qualified health care                            professional performing the diagnostic or                            therapeutic service that the sedation supports,                            requiring the presence of an independent trained                            observer to assist in the monitoring of the                            patient's level of consciousness and physiological                            status; initial 15 minutes of intraservice time,                            patient age 75 years or older                           [phone], Moderate sedation services; each additional                            15 minutes intraservice time Diagnosis Code(s):        --- Professional ---                           Z12.11, Encounter for screening for malignant                            neoplasm of colon                           K64.4, Residual hemorrhoidal skin tags                           D49.0, Neoplasm of unspecified behavior of                            digestive system                            K57.30, Diverticulosis of large intestine without                            perforation or abscess without bleeding CPT copyright 2016 American Medical Association. All rights reserved. The codes documented in this report are preliminary and upon coder review may  be revised to meet current compliance requirements. Barney Drain, MD Barney Drain, MD 09/15/2016 10:22:43 AM This report has been signed electronically. Number of Addenda: 0

## 2016-09-15 NOTE — Discharge Instructions (Signed)
You have EXTERNAL hemorrhoids. YOU DID NOT HAVE ANY COLON POLYPS. I REMOVED A POLYPOID LESION FROM YOUR ANAL CANAL.   DRINK WATER TO KEEP YOUR URINE LIGHT YELLOW.  FOLLOW A HIGH FIBER DIET. AVOID ITEMS THAT CAUSE BLOATING. SEE INFO BELOW.  YOU DO NOT NEED IRON. YOU SHOULD ONLY TAKE IRON IF YOU HAVE A LOW BLOOD COUNT AND/OR IRON. TOO MUCH IRON IN THE BODY INCREASE YOUR RISK FOR INFECTION.  USE PREPARATION H FOUR TIMES  A DAY IF NEEDED TO RELIEVE RECTAL PAIN/PRESSURE/BLEEDING.  Next colonoscopy in 10-15 years IF THE BENEFITS OUTWEIGH THE RISKS.  Colonoscopy Care After Read the instructions outlined below and refer to this sheet in the next week. These discharge instructions provide you with general information on caring for yourself after you leave the hospital. While your treatment has been planned according to the most current medical practices available, unavoidable complications occasionally occur. If you have any problems or questions after discharge, call DR. Daimen Shovlin, [phone].  ACTIVITY  You may resume your regular activity, but move at a slower pace for the next 24 hours.   Take frequent rest periods for the next 24 hours.   Walking will help get rid of the air and reduce the bloated feeling in your belly (abdomen).   No driving for 24 hours (because of the medicine (anesthesia) used during the test).   You may shower.   Do not sign any important legal documents or operate any machinery for 24 hours (because of the anesthesia used during the test).    NUTRITION  Drink plenty of fluids.   You may resume your normal diet as instructed by your doctor.   Begin with a light meal and progress to your normal diet. Heavy or fried foods are harder to digest and may make you feel sick to your stomach (nauseated).   Avoid alcoholic beverages for 24 hours or as instructed.    MEDICATIONS  You may resume your normal medications.   WHAT YOU CAN EXPECT TODAY  Some feelings  of bloating in the abdomen.   Passage of more gas than usual.   Spotting of blood in your stool or on the toilet paper  .  IF YOU HAD POLYPS REMOVED DURING THE COLONOSCOPY:  Eat a soft diet IF YOU HAVE NAUSEA, BLOATING, ABDOMINAL PAIN, OR VOMITING.    FINDING OUT THE RESULTS OF YOUR TEST Not all test results are available during your visit. DR. Oneida Alar WILL CALL YOU WITHIN 14 DAYS OF YOUR PROCEDUE WITH YOUR RESULTS. Do not assume everything is normal if you have not heard from DR. Taylie Helder, CALL HER OFFICE AT [phone].  SEEK IMMEDIATE MEDICAL ATTENTION AND CALL THE OFFICE: [phone] IF:  You have more than a spotting of blood in your stool.   Your belly is swollen (abdominal distention).   You are nauseated or vomiting.   You have a temperature over 101F.   You have abdominal pain or discomfort that is severe or gets worse throughout the day.  High-Fiber Diet A high-fiber diet changes your normal diet to include more whole grains, legumes, fruits, and vegetables. Changes in the diet involve replacing refined carbohydrates with unrefined foods. The calorie level of the diet is essentially unchanged. The Dietary Reference Intake (recommended amount) for adult males is 38 grams per day. For adult females, it is 25 grams per day. Pregnant and lactating women should consume 28 grams of fiber per day. Fiber is the intact part of a plant that is not  broken down during digestion. Functional fiber is fiber that has been isolated from the plant to provide a beneficial effect in the body. PURPOSE  Increase stool bulk.   Ease and regulate bowel movements.   Lower cholesterol.   REDUCE RISK OF COLON CANCER  INDICATIONS THAT YOU NEED MORE FIBER  Constipation and hemorrhoids.   Uncomplicated diverticulosis (intestine condition) and irritable bowel syndrome.   Weight management.   As a protective measure against hardening of the arteries (atherosclerosis), diabetes, and cancer.    GUIDELINES FOR INCREASING FIBER IN THE DIET  Start adding fiber to the diet slowly. A gradual increase of about 5 more grams (2 slices of whole-wheat bread, 2 servings of most fruits or vegetables, or 1 bowl of high-fiber cereal) per day is best. Too rapid an increase in fiber may result in constipation, flatulence, and bloating.   Drink enough water and fluids to keep your urine clear or pale yellow. Water, juice, or caffeine-free drinks are recommended. Not drinking enough fluid may cause constipation.   Eat a variety of high-fiber foods rather than one type of fiber.   Try to increase your intake of fiber through using high-fiber foods rather than fiber pills or supplements that contain small amounts of fiber.   The goal is to change the types of food eaten. Do not supplement your present diet with high-fiber foods, but replace foods in your present diet.  INCLUDE A VARIETY OF FIBER SOURCES  Replace refined and processed grains with whole grains, canned fruits with fresh fruits, and incorporate other fiber sources. White rice, white breads, and most bakery goods contain little or no fiber.   Brown whole-grain rice, buckwheat oats, and many fruits and vegetables are all good sources of fiber. These include: broccoli, Brussels sprouts, cabbage, cauliflower, beets, sweet potatoes, white potatoes (skin on), carrots, tomatoes, eggplant, squash, berries, fresh fruits, and dried fruits.   Cereals appear to be the richest source of fiber. Cereal fiber is found in whole grains and bran. Bran is the fiber-rich outer coat of cereal grain, which is largely removed in refining. In whole-grain cereals, the bran remains. In breakfast cereals, the largest amount of fiber is found in those with "bran" in their names. The fiber content is sometimes indicated on the label.   You may need to include additional fruits and vegetables each day.   In baking, for 1 cup white flour, you may use the following  substitutions:   1 cup whole-wheat flour minus 2 tablespoons.   1/2 cup white flour plus 1/2 cup whole-wheat flour.   Hemorrhoids Hemorrhoids are dilated (enlarged) veins around the rectum. Sometimes clots will form in the veins. This makes them swollen and painful. These are called thrombosed hemorrhoids. Causes of hemorrhoids include:  Constipation.   Straining to have a bowel movement.   HEAVY LIFTING HOME CARE INSTRUCTIONS  Eat a well balanced diet and drink 6 to 8 glasses of water every day to avoid constipation. You may also use a bulk laxative.   Avoid straining to have bowel movements.   Keep anal area dry and clean.   Do not use a donut shaped pillow or sit on the toilet for long periods. This increases blood pooling and pain.   Move your bowels when your body has the urge; this will require less straining and will decrease pain and pressure.

## 2016-09-17 ENCOUNTER — Encounter (HOSPITAL_COMMUNITY): Payer: Self-pay | Admitting: Gastroenterology

## 2016-09-23 ENCOUNTER — Telehealth: Payer: Self-pay

## 2016-09-23 NOTE — Telephone Encounter (Signed)
Per Dr. Oneida Alar, I called to inform pt of his previous colonoscopy report from Dr. Britta Mccreedy.  Colonoscopy done 09/15/2011 by Dr. Britta Mccreedy showed he had a tubular adenoma in the sigmoid and in the rectum.  Pt was in bad area and said he would call me back when he has better reception.

## 2016-09-23 NOTE — Telephone Encounter (Signed)
Pt called back and is aware

## 2016-10-08 ENCOUNTER — Telehealth: Payer: Self-pay | Admitting: Gastroenterology

## 2016-10-08 ENCOUNTER — Other Ambulatory Visit: Payer: Self-pay

## 2016-10-08 ENCOUNTER — Encounter: Payer: Self-pay | Admitting: Gastroenterology

## 2016-10-08 DIAGNOSIS — A630 Anogenital (venereal) warts: Secondary | ICD-10-CM

## 2016-10-08 NOTE — Telephone Encounter (Signed)
Called patient TO DISCUSS RESULTS. LVM-CALL [phone] TO DISCUSS.   PT'S ANAL CANAL BIOPSIES SHOW ANAL WARTS. PT NEEDS TO SEE SURGERY OR DERMATOLOGY FOR COMPLETE EXAM OF THE ANAL MUCOSA.

## 2016-10-08 NOTE — Telephone Encounter (Signed)
PT'S ANAL CANAL BIOPSIES SHOW ANAL WARTS. PT NEEDS TO SEE SURGERY OR DERMATOLOGY FOR COMPLETE EXAM OF THE ANAL MUCOSA. (PER SF. SEE OTHER PHONE NOTE FROM 10/08/2016)

## 2016-10-08 NOTE — Telephone Encounter (Signed)
Referral has been sent to Dr.Hall

## 2016-10-08 NOTE — Telephone Encounter (Signed)
Forwarded to GF if referral was needed.

## 2017-03-24 NOTE — Progress Notes (Signed)
 [facility] at [place]   HPI:   #1. Polymyositis right arm:  Patient had debridement right upper arm on 01/27/17. He is doing extremely well. He has no pain and no discomfort good range of motion and good use of his right upper extremity. Patient is ready to return to work     No past medical history on file.  Past Surgical History:  Procedure Laterality Date  . APPENDECTOMY    . BACK    . HERNIA    . HERNIA REPAIR    . I & D AND DEBRIDEMENT RIGHT UPPER ARM OVER TRICEP   01/27/2017  . RIGHT ELBOW    . SPINE SURGERY      Family History  Problem Relation Age of Onset  . Family history unknown: Yes    Social History   Social History  . Marital status: N/A    Spouse name: N/A  . Number of children: N/A  . Years of education: N/A   Occupational History  . Not on file.   Social History Main Topics  . Smoking status: Current Every Day Smoker    Years: 0.25  . Smokeless tobacco: Never Used  . Alcohol use Yes  . Drug use: No  . Sexual activity: Not on file   Other Topics Concern  . Not on file   Social History Narrative  . No narrative on file    I have reviewed past medical, surgical, social and family history, medications and allergies as documented in the EMR.  Review of Systems:  A comprehensive review of systems is documented elsewhere in the encounter.   General Exam:  General/Constitutional: No apparent distress: well-nourished and well developed. Eyes: Pupils equal, round with synchronous movement. Respiratory: Non-labored breathing Lungs: normal inspiratory effort Cor: regular rate and rhythm. ABD: soft, non tender. No masses.   Vascular: No edema, swelling or tenderness, except as noted in detailed exam. Integumentary: No impressive skin lesions present, except as noted in detailed exam. Neuro/Psych: Normal mood and affect, oriented to person, place and time. Musculoskeletal: Normal, except as noted in detailed exam and in  HPI.  Physical exam:  Right arm: Scar on the upper arm is totally healed. Some adherence to the underlying triceps muscle. No tenderness. Full flexion full extension of the elbow with full pronation and supination. He has excellent triceps strength and I cannot break him down in extension..    Diagnoses Polymyositis right arm   1. Polymyositis with skin involvement (CMS-HCC)        Treatment Plans:   #1. Patient has had a dramatic recovery with full function of his arm and elbow  #2. He may return to work on 03/30/17 has no restrictions  #3. Discharged to return when necessary

## 2018-06-11 ENCOUNTER — Encounter (HOSPITAL_COMMUNITY): Payer: Self-pay | Admitting: Emergency Medicine

## 2018-06-11 ENCOUNTER — Emergency Department (HOSPITAL_COMMUNITY)
Admission: EM | Admit: 2018-06-11 | Discharge: 2018-06-12 | Disposition: A | Discharge status: Home / Self Care | Payer: 59 | Attending: Emergency Medicine | Admitting: Emergency Medicine

## 2018-06-11 ENCOUNTER — Other Ambulatory Visit: Payer: Self-pay

## 2018-06-11 DIAGNOSIS — S0502XA Injury of conjunctiva and corneal abrasion without foreign body, left eye, initial encounter: Secondary | ICD-10-CM | POA: Insufficient documentation

## 2018-06-11 DIAGNOSIS — Y999 Unspecified external cause status: Secondary | ICD-10-CM | POA: Insufficient documentation

## 2018-06-11 DIAGNOSIS — Y9389 Activity, other specified: Secondary | ICD-10-CM | POA: Diagnosis not present

## 2018-06-11 DIAGNOSIS — Y929 Unspecified place or not applicable: Secondary | ICD-10-CM | POA: Diagnosis not present

## 2018-06-11 DIAGNOSIS — Z87891 Personal history of nicotine dependence: Secondary | ICD-10-CM | POA: Diagnosis not present

## 2018-06-11 DIAGNOSIS — W890XXA Exposure to welding light (arc), initial encounter: Secondary | ICD-10-CM | POA: Diagnosis not present

## 2018-06-11 DIAGNOSIS — Z79899 Other long term (current) drug therapy: Secondary | ICD-10-CM | POA: Diagnosis not present

## 2018-06-11 DIAGNOSIS — H16133 Photokeratitis, bilateral: Secondary | ICD-10-CM | POA: Insufficient documentation

## 2018-06-11 DIAGNOSIS — H5713 Ocular pain, bilateral: Secondary | ICD-10-CM | POA: Diagnosis present

## 2018-06-11 MED ORDER — ERYTHROMYCIN 5 MG/GM OP OINT
TOPICAL_OINTMENT | Freq: Once | OPHTHALMIC | Status: AC
  Administered 2018-06-12: 1 via OPHTHALMIC
  Filled 2018-06-11: qty 3.5

## 2018-06-11 MED ORDER — OXYCODONE-ACETAMINOPHEN 5-325 MG PO TABS
1.0000 | ORAL_TABLET | Freq: Once | ORAL | Status: AC
  Administered 2018-06-11: 1 via ORAL
  Filled 2018-06-11: qty 1

## 2018-06-11 MED ORDER — TETRACAINE HCL 0.5 % OP SOLN
2.0000 [drp] | Freq: Once | OPHTHALMIC | Status: AC
  Administered 2018-06-11: 2 [drp] via OPHTHALMIC

## 2018-06-11 MED ORDER — FLUORESCEIN SODIUM 1 MG OP STRP
ORAL_STRIP | OPHTHALMIC | Status: AC
  Filled 2018-06-11: qty 1

## 2018-06-11 MED ORDER — FLUORESCEIN SODIUM 1 MG OP STRP
1.0000 | ORAL_STRIP | Freq: Once | OPHTHALMIC | Status: AC
  Administered 2018-06-11: 1 via OPHTHALMIC

## 2018-06-11 MED ORDER — TETRACAINE HCL 0.5 % OP SOLN
OPHTHALMIC | Status: AC
  Filled 2018-06-11: qty 4

## 2018-06-11 NOTE — ED Notes (Signed)
ED Provider at bedside. 

## 2018-06-11 NOTE — ED Triage Notes (Signed)
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 Pt states he has had burning in his eyes around 2 hours ago.
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
## 2018-06-12 MED ORDER — OXYCODONE-ACETAMINOPHEN 5-325 MG PO TABS: 1.0000 | ORAL_TABLET | ORAL | 0 refills | Status: DC | PRN

## 2018-06-12 MED ORDER — ERYTHROMYCIN 5 MG/GM OP OINT: TOPICAL_OINTMENT | OPHTHALMIC | 0 refills | Status: DC

## 2018-06-12 NOTE — Discharge Instructions (Signed)
Take the pain medication as prescribed.  Do not take it if you are working or driving.  Take the antibiotic ointment as prescribed and follow-up with the ophthalmologist.  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  Return to the ED if you develop new or worsening symptoms.

## 2019-08-15 ENCOUNTER — Other Ambulatory Visit: Payer: Self-pay

## 2019-08-15 ENCOUNTER — Ambulatory Visit: Payer: 59 | Attending: Internal Medicine

## 2019-08-15 DIAGNOSIS — Z20822 Contact with and (suspected) exposure to covid-19: Secondary | ICD-10-CM

## 2019-08-16 LAB — NOVEL CORONAVIRUS, NAA: SARS-CoV-2, NAA: NOT DETECTED

## 2021-07-11 ENCOUNTER — Other Ambulatory Visit: Payer: Self-pay | Admitting: Family Medicine

## 2021-07-11 ENCOUNTER — Other Ambulatory Visit (HOSPITAL_COMMUNITY): Payer: Self-pay | Admitting: Family Medicine

## 2021-07-11 DIAGNOSIS — N390 Urinary tract infection, site not specified: Secondary | ICD-10-CM

## 2021-07-18 ENCOUNTER — Ambulatory Visit (HOSPITAL_COMMUNITY)
Admission: RE | Admit: 2021-07-18 | Discharge: 2021-07-18 | Disposition: A | Discharge status: Home / Self Care | Payer: 59 | Source: Ambulatory Visit | Attending: Family Medicine | Admitting: Family Medicine

## 2021-07-18 ENCOUNTER — Other Ambulatory Visit: Payer: Self-pay

## 2021-07-18 ENCOUNTER — Encounter (HOSPITAL_COMMUNITY): Payer: Self-pay

## 2021-07-18 DIAGNOSIS — N390 Urinary tract infection, site not specified: Secondary | ICD-10-CM | POA: Insufficient documentation

## 2021-08-28 NOTE — Telephone Encounter (Signed)
 Waiting on placement date and time from clinical sup

## 2022-05-14 DIAGNOSIS — D225 Melanocytic nevi of trunk: Secondary | ICD-10-CM | POA: Diagnosis not present

## 2022-05-14 DIAGNOSIS — L821 Other seborrheic keratosis: Secondary | ICD-10-CM | POA: Diagnosis not present

## 2022-05-14 DIAGNOSIS — X32XXXD Exposure to sunlight, subsequent encounter: Secondary | ICD-10-CM | POA: Diagnosis not present

## 2022-05-14 DIAGNOSIS — L570 Actinic keratosis: Secondary | ICD-10-CM | POA: Diagnosis not present

## 2022-05-15 DIAGNOSIS — D649 Anemia, unspecified: Secondary | ICD-10-CM | POA: Diagnosis not present

## 2022-05-15 DIAGNOSIS — R5382 Chronic fatigue, unspecified: Secondary | ICD-10-CM | POA: Diagnosis not present

## 2022-05-15 DIAGNOSIS — Z1329 Encounter for screening for other suspected endocrine disorder: Secondary | ICD-10-CM | POA: Diagnosis not present

## 2022-05-15 DIAGNOSIS — R739 Hyperglycemia, unspecified: Secondary | ICD-10-CM | POA: Diagnosis not present

## 2022-05-22 DIAGNOSIS — F17210 Nicotine dependence, cigarettes, uncomplicated: Secondary | ICD-10-CM | POA: Diagnosis not present

## 2022-05-22 DIAGNOSIS — Z0001 Encounter for general adult medical examination with abnormal findings: Secondary | ICD-10-CM | POA: Diagnosis not present

## 2022-05-22 DIAGNOSIS — Z23 Encounter for immunization: Secondary | ICD-10-CM | POA: Diagnosis not present

## 2022-09-30 DIAGNOSIS — D510 Vitamin B12 deficiency anemia due to intrinsic factor deficiency: Secondary | ICD-10-CM | POA: Diagnosis not present

## 2022-09-30 DIAGNOSIS — R7309 Other abnormal glucose: Secondary | ICD-10-CM | POA: Diagnosis not present

## 2023-06-30 DIAGNOSIS — R7989 Other specified abnormal findings of blood chemistry: Secondary | ICD-10-CM | POA: Diagnosis not present

## 2023-06-30 DIAGNOSIS — R7309 Other abnormal glucose: Secondary | ICD-10-CM | POA: Diagnosis not present

## 2023-06-30 DIAGNOSIS — D510 Vitamin B12 deficiency anemia due to intrinsic factor deficiency: Secondary | ICD-10-CM | POA: Diagnosis not present

## 2023-06-30 DIAGNOSIS — Z0001 Encounter for general adult medical examination with abnormal findings: Secondary | ICD-10-CM | POA: Diagnosis not present

## 2024-04-20 DIAGNOSIS — B078 Other viral warts: Secondary | ICD-10-CM | POA: Diagnosis not present

## 2024-04-20 DIAGNOSIS — D0372 Melanoma in situ of left lower limb, including hip: Secondary | ICD-10-CM | POA: Diagnosis not present

## 2024-04-20 DIAGNOSIS — D225 Melanocytic nevi of trunk: Secondary | ICD-10-CM | POA: Diagnosis not present

## 2024-04-20 DIAGNOSIS — Z1283 Encounter for screening for malignant neoplasm of skin: Secondary | ICD-10-CM | POA: Diagnosis not present

## 2024-05-04 ENCOUNTER — Other Ambulatory Visit: Payer: Self-pay | Admitting: General Surgery

## 2024-05-04 DIAGNOSIS — D0372 Melanoma in situ of left lower limb, including hip: Secondary | ICD-10-CM | POA: Diagnosis not present

## 2024-05-23 NOTE — Pre-Procedure Instructions (Signed)
 Surgical Instructions   Your procedure is scheduled on May 31, 2024. Report to [facility] Main Entrance A at 6:30 A.M., then check in with the Admitting office. Any questions or running late day of surgery: call [phone]  Questions prior to your surgery date: call [phone], Monday-Friday, 8am-4pm. If you experience any cold or flu symptoms such as cough, fever, chills, shortness of breath, etc. between now and your scheduled surgery, please notify us  at the above number.     Remember:  Do not eat after midnight the night before your surgery   You may drink clear liquids until 5:30 AM the morning of your surgery.   Clear liquids allowed are: Water , Non-Citrus Juices (without pulp), Carbonated Beverages, Clear Tea (no milk, honey, etc.), Black Coffee Only (NO MILK, CREAM OR POWDERED CREAMER of any kind), and Gatorade.    Take these medicines the morning of surgery with A SIP OF WATER : cetirizine (ZYRTEC)    One week prior to surgery, STOP taking any Aspirin (unless otherwise instructed by your surgeon) Aleve, Naproxen, Ibuprofen, Motrin, Advil, Goody's, BC's, all herbal medications, fish oil, and non-prescription vitamins.                     Do NOT Smoke (Tobacco/Vaping) for 24 hours prior to your procedure.  If you use a CPAP at night, you may bring your mask/headgear for your overnight stay.   You will be asked to remove any contacts, glasses, piercing's, hearing aid's, dentures/partials prior to surgery. Please bring cases for these items if needed.    Patients discharged the day of surgery will not be allowed to drive home, and someone needs to stay with them for 24 hours.  SURGICAL WAITING ROOM VISITATION Patients may have no more than 2 support people in the waiting area - these visitors may rotate.   Pre-op nurse will coordinate an appropriate time for 1 ADULT support person, who may not rotate, to accompany patient in pre-op.  Children under the age of 12  must have an adult with them who is not the patient and must remain in the main waiting area with an adult.  If the patient needs to stay at the hospital during part of their recovery, the visitor guidelines for inpatient rooms apply.  Please refer to the [facility] website for the visitor guidelines for any additional information.   If you received a COVID test during your pre-op visit  it is requested that you wear a mask when out in public, stay away from anyone that may not be feeling well and notify your surgeon if you develop symptoms. If you have been in contact with anyone that has tested positive in the last 10 days please notify you surgeon.      Pre-operative CHG Bathing Instructions   You can play a key role in reducing the risk of infection after surgery. Your skin needs to be as free of germs as possible. You can reduce the number of germs on your skin by washing with CHG (chlorhexidine gluconate) soap before surgery. CHG is an antiseptic soap that kills germs and continues to kill germs even after washing.   DO NOT use if you have an allergy to chlorhexidine/CHG or antibacterial soaps. If your skin becomes reddened or irritated, stop using the CHG and notify one of our RNs at [phone].              TAKE A SHOWER THE NIGHT BEFORE SURGERY   Please  keep in mind the following:  DO NOT shave, including legs and underarms, 48 hours prior to surgery.   You may shave your face before/day of surgery.  Place clean sheets on your bed the night before surgery Use a clean washcloth (not used since being washed) for shower. DO NOT sleep with pet's night before surgery.  CHG Shower Instructions:  Wash your face and private area with normal soap. If you choose to wash your hair, wash first with your normal shampoo.  After you use shampoo/soap, rinse your hair and body thoroughly to remove shampoo/soap residue.  Turn the water  OFF and apply half the bottle of CHG soap to a CLEAN  washcloth.  Apply CHG soap ONLY FROM YOUR NECK DOWN TO YOUR TOES (washing for 3-5 minutes)  DO NOT use CHG soap on face, private areas, open wounds, or sores.  Pay special attention to the area where your surgery is being performed.  If you are having back surgery, having someone wash your back for you may be helpful. Wait 2 minutes after CHG soap is applied, then you may rinse off the CHG soap.  Pat dry with a clean towel  Put on clean pajamas    Additional instructions for the day of surgery: If you choose, you may shower the morning of surgery with an antibacterial soap.  DO NOT APPLY any lotions, deodorants, cologne, or perfumes.   Do not wear jewelry or makeup Do not wear nail polish, gel polish, artificial nails, or any other type of covering on natural nails (fingers and toes) Do not bring valuables to the hospital. [facility] is not responsible for valuables/personal belongings. Put on clean/comfortable clothes.  Please brush your teeth.  Ask your nurse before applying any prescription medications to the skin.

## 2024-05-24 ENCOUNTER — Encounter (HOSPITAL_COMMUNITY)
Admission: RE | Admit: 2024-05-24 | Discharge: 2024-05-24 | Disposition: A | Discharge status: Home / Self Care | Source: Ambulatory Visit | Attending: General Surgery | Admitting: General Surgery

## 2024-05-24 ENCOUNTER — Other Ambulatory Visit: Payer: Self-pay

## 2024-05-24 ENCOUNTER — Encounter (HOSPITAL_COMMUNITY): Payer: Self-pay

## 2024-05-24 VITALS — BP 155/74 | HR 58 | Temp 97.6°F | Resp 18 | Ht 71.0 in | Wt 181.2 lb

## 2024-05-24 DIAGNOSIS — Z01818 Encounter for other preprocedural examination: Secondary | ICD-10-CM

## 2024-05-24 DIAGNOSIS — Z01812 Encounter for preprocedural laboratory examination: Secondary | ICD-10-CM | POA: Diagnosis not present

## 2024-05-24 LAB — CBC
HCT: 47.3 % (ref 39.0–52.0)
Hemoglobin: 16 g/dL (ref 13.0–17.0)
MCH: 32.1 pg (ref 26.0–34.0)
MCHC: 33.8 g/dL (ref 30.0–36.0)
MCV: 95 fL (ref 80.0–100.0)
Platelets: 154 K/uL (ref 150–400)
RBC: 4.98 MIL/uL (ref 4.22–5.81)
RDW: 14.1 % (ref 11.5–15.5)
WBC: 6 K/uL (ref 4.0–10.5)
nRBC: 0 % (ref 0.0–0.2)

## 2024-05-24 NOTE — Progress Notes (Signed)
 PCP - Elsie Glendia Brought, PA at [place] Cardiologist -   PPM/ICD - denies Device Orders - na Rep Notified - na  Chest x-ray - na EKG - na Stress Test -  ECHO -  Cardiac Cath -   Sleep Study - denies CPAP - na  Non-diabetic  Blood Thinner Instructions:denies Aspirin Instructions:denies  ERAS Protcol - Clears until 0530  Anesthesia review: No  Patient denies shortness of breath, fever, cough and chest pain at PAT appointment   All instructions explained to the patient, with a verbal understanding of the material. Patient agrees to go over the instructions while at home for a better understanding. Patient also instructed to self quarantine after being tested for COVID-19. The opportunity to ask questions was provided.

## 2024-05-30 NOTE — H&P (Signed)
 REFERRING PHYSICIAN:  Norleen HILARIO Shona Mickey. M.D. * PROVIDER:  JINA CLAIR NEPHEW, MD MRN: [id number] DOB: [birth year] DATE OF ENCOUNTER: 05/04/2024 Subjective    Plan Chief Complaint: New Cancer (Left lat. thigh Malignant Melanoma in SITU perpheral margin)   History of Present Illness: Grant Clark is a 78 y.o. male who is seen today as an office consultation for evaluation of New Cancer (Left lat. 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  The patient is referred by Dr. Norleen Shona.  He had a pigmented lesion on the top of his left thigh that was present since kindergarten.  It started changing and his wife noticed that it was becoming less pigmented.  She made an appointment with dermatology for him and it was biopsied.  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  Regression was suggested.  Patient denies history of cancer in his family.   Patient is healthy and does not take any medications other than vitamins.   Patient golfs and takes care of his own leaves.  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  He also plays golf with some of his friends.     Path is from [facility] laboratories and is accession [phone].  This shows malignant melanoma in situ with peripheral margins involved.  There is fibrosis suggesting a regressive change involving the dermis.  This progressive appearance makes it difficult to exclude previous melanoma in the dermis.     Review of Systems: A complete review of systems was obtained from the patient.  I have reviewed this information and discussed as appropriate with the patient.  See HPI as well for other ROS.   Review of Systems  HENT:  Positive for hearing loss.   Respiratory:  Positive for cough.   Endo/Heme/Allergies:  Bruises/bleeds easily.  All other systems reviewed and are negative.     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        Family History  Family History  Family history unknown: Yes        Tobacco Use History  Social History        Tobacco Use  Smoking Status Former   Types: Cigarettes  Smokeless Tobacco Never        Social History  Social History         Socioeconomic  History   Marital status: Married  Tobacco Use   Smoking status: Former      Types: Cigarettes   Smokeless tobacco: Never  Substance and Sexual Activity   Alcohol use: Yes      Alcohol/week: 0.0 - 1.0 standard drinks of alcohol   Drug use: Never   Sexual activity: Defer    Social Drivers of Health        Housing Stability: Unknown (05/04/2024)    Housing Stability Vital Sign     Homeless in the Last Year: No        Objective:       Vitals:    05/04/24 1143  BP: (!) 156/73  Pulse: 68  Temp: 36.6 C (97.9 F)  TempSrc: Temporal  SpO2: 96%  Weight: 81.8 kg (180 lb 6.4 oz)  Height: 180.3 cm (5' 11)  PainSc: 0-No pain    Body mass index is 25.16 kg/m.     Head:   Normocephalic and atraumatic.  Mouth/Throat: Oropharynx is clear and moist. No oropharyngeal exudate.  Eyes:   Conjunctivae are normal. Pupils are equal, round, and reactive to  light. No scleral icterus.  Neck:   Normal range of motion. Neck supple. No tracheal deviation present. No thyromegaly present.  Cardiovascular: Normal rate, regular rhythm, normal heart sounds and intact distal pulses.  Exam reveals no gallop and no friction rub.   No murmur heard. Respiratory: Effort normal and breath sounds normal. No respiratory distress. No wheezes, rales or rhonchi.  No chest wall tenderness.  GI:       Soft. Bowel sounds are normal. Abdomen is soft, non tender, non distended.  No masses or hepatosplenomegaly is present. There is no rebound and no guarding.  Musculoskeletal: . Extremities are non tender and without deformity.  Lymphadenopathy:   No cervical or axillary adenopathy.  No inguinal adenopathy present. Neurological: Alert and oriented to person, place, and time. Coordination normal.  Skin:    Skin is warm and dry. No rash noted. No diaphoresis. No erythema. No pallor.  Patient has a shave biopsy site on his anterior left thigh that is healing well.  This is around 3 cm in diameter.  There is no residual pigmented lesion present.  There is no cellulitis. Psychiatric: Normal mood and affect.Behavior is normal. Judgment and thought content normal.      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  We will plan 5 mm margins.  Given that this is noninvasive melanoma, I will not plan to do a sentinel lymph node biopsy.  I discussed the procedure with the patient and his wife.  I reviewed the risks including bleeding, infection, wound dehiscence, heart or lung complications.  I discussed that should the wound breakdown that we would not reclose it.  The patient has had a wound VAC on his elbow before for wound breakdown so he has experience with prolonged wound healing in the  past.   I discussed that he will need at least 2 weeks of no strenuous activity following surgery.  I discussed that he could ride his riding lawnmower and go to the putting green postoperatively, but that would be the extent of the allowable activity.   We will do this at the first available opportunity.  JINA CLAIR NEPHEW, MD

## 2024-05-31 ENCOUNTER — Ambulatory Visit (HOSPITAL_COMMUNITY)
Admission: RE | Admit: 2024-05-31 | Discharge: 2024-05-31 | Disposition: A | Discharge status: Home / Self Care | Payer: Self-pay | Attending: General Surgery | Admitting: General Surgery

## 2024-05-31 ENCOUNTER — Encounter (HOSPITAL_COMMUNITY): Payer: Self-pay | Admitting: General Surgery

## 2024-05-31 ENCOUNTER — Encounter (HOSPITAL_COMMUNITY)
Admission: RE | Disposition: A | Discharge status: Home / Self Care | Payer: Self-pay | Source: Home / Self Care | Attending: General Surgery

## 2024-05-31 ENCOUNTER — Ambulatory Visit (HOSPITAL_COMMUNITY): Admitting: Certified Registered Nurse Anesthetist

## 2024-05-31 DIAGNOSIS — D0372 Melanoma in situ of left lower limb, including hip: Secondary | ICD-10-CM | POA: Diagnosis not present

## 2024-05-31 HISTORY — PX: ROTATION FLAP: SHX6211

## 2024-05-31 SURGERY — CREATION, FLAP, ROTATION
Anesthesia: General | Site: Thigh | Laterality: Left

## 2024-05-31 MED ORDER — DROPERIDOL 2.5 MG/ML IJ SOLN: 0.6250 mg | Freq: Once | INTRAMUSCULAR | Status: DC | PRN

## 2024-05-31 MED ORDER — FENTANYL CITRATE (PF) 100 MCG/2ML IJ SOLN: 25.0000 ug | INTRAMUSCULAR | Status: DC | PRN

## 2024-05-31 MED ORDER — FENTANYL CITRATE (PF) 250 MCG/5ML IJ SOLN
INTRAMUSCULAR | Status: DC | PRN
  Administered 2024-05-31 (×2): 50 ug via INTRAVENOUS

## 2024-05-31 MED ORDER — ACETAMINOPHEN 500 MG PO TABS
1000.0000 mg | ORAL_TABLET | ORAL | Status: AC
  Administered 2024-05-31: 1000 mg via ORAL
  Filled 2024-05-31: qty 2

## 2024-05-31 MED ORDER — SUGAMMADEX SODIUM 200 MG/2ML IV SOLN
INTRAVENOUS | Status: DC | PRN
  Administered 2024-05-31: 170 mg via INTRAVENOUS

## 2024-05-31 MED ORDER — PROPOFOL 10 MG/ML IV BOLUS
INTRAVENOUS | Status: AC
  Filled 2024-05-31: qty 20

## 2024-05-31 MED ORDER — CHLORHEXIDINE GLUCONATE CLOTH 2 % EX PADS: 6.0000 | MEDICATED_PAD | Freq: Once | CUTANEOUS | Status: DC

## 2024-05-31 MED ORDER — FENTANYL CITRATE (PF) 250 MCG/5ML IJ SOLN
INTRAMUSCULAR | Status: AC
  Filled 2024-05-31: qty 5

## 2024-05-31 MED ORDER — OXYCODONE HCL 5 MG/5ML PO SOLN: 5.0000 mg | Freq: Once | ORAL | Status: DC | PRN

## 2024-05-31 MED ORDER — PHENYLEPHRINE 80 MCG/ML (10ML) SYRINGE FOR IV PUSH (FOR BLOOD PRESSURE SUPPORT)
PREFILLED_SYRINGE | INTRAVENOUS | Status: DC | PRN
  Administered 2024-05-31 (×4): 80 ug via INTRAVENOUS

## 2024-05-31 MED ORDER — LIDOCAINE HCL 1 % IJ SOLN
INTRAMUSCULAR | Status: AC
  Filled 2024-05-31: qty 20

## 2024-05-31 MED ORDER — CHLORHEXIDINE GLUCONATE 0.12 % MT SOLN
15.0000 mL | Freq: Once | OROMUCOSAL | Status: AC
  Administered 2024-05-31: 15 mL via OROMUCOSAL
  Filled 2024-05-31: qty 15

## 2024-05-31 MED ORDER — LACTATED RINGERS IV SOLN: INTRAVENOUS | Status: DC

## 2024-05-31 MED ORDER — DEXAMETHASONE SOD PHOSPHATE PF 10 MG/ML IJ SOLN
INTRAMUSCULAR | Status: DC | PRN
  Administered 2024-05-31: 8 mg via INTRAVENOUS

## 2024-05-31 MED ORDER — OXYCODONE HCL 5 MG PO TABS: 5.0000 mg | ORAL_TABLET | Freq: Once | ORAL | Status: DC | PRN

## 2024-05-31 MED ORDER — METHYLENE BLUE 20 MG/2ML IV SOSY
PREFILLED_SYRINGE | INTRAVENOUS | Status: AC
  Filled 2024-05-31: qty 2

## 2024-05-31 MED ORDER — ACETAMINOPHEN 10 MG/ML IV SOLN: 1000.0000 mg | Freq: Once | INTRAVENOUS | Status: DC | PRN

## 2024-05-31 MED ORDER — ONDANSETRON HCL 4 MG/2ML IJ SOLN
INTRAMUSCULAR | Status: DC | PRN
  Administered 2024-05-31: 4 mg via INTRAVENOUS

## 2024-05-31 MED ORDER — LIDOCAINE HCL (CARDIAC) PF 100 MG/5ML IV SOSY
PREFILLED_SYRINGE | INTRAVENOUS | Status: DC | PRN
  Administered 2024-05-31: 50 mg via INTRAVENOUS

## 2024-05-31 MED ORDER — LIDOCAINE HCL 1 % IJ SOLN
INTRAMUSCULAR | Status: DC | PRN
  Administered 2024-05-31: 37 mL via INTRAMUSCULAR

## 2024-05-31 MED ORDER — CHLORHEXIDINE GLUCONATE CLOTH 2 % EX PADS
6.0000 | MEDICATED_PAD | Freq: Once | CUTANEOUS | Status: DC
Start: 2024-05-31 — End: 2024-05-31

## 2024-05-31 MED ORDER — PROPOFOL 10 MG/ML IV BOLUS
INTRAVENOUS | Status: DC | PRN
  Administered 2024-05-31: 130 mg via INTRAVENOUS

## 2024-05-31 MED ORDER — ORAL CARE MOUTH RINSE: 15.0000 mL | Freq: Once | OROMUCOSAL | Status: AC

## 2024-05-31 MED ORDER — CEFAZOLIN SODIUM-DEXTROSE 2-4 GM/100ML-% IV SOLN
2.0000 g | INTRAVENOUS | Status: AC
  Administered 2024-05-31: 2 g via INTRAVENOUS
  Filled 2024-05-31: qty 100

## 2024-05-31 MED ORDER — BUPIVACAINE-EPINEPHRINE (PF) 0.25% -1:200000 IJ SOLN
INTRAMUSCULAR | Status: AC
  Filled 2024-05-31: qty 30

## 2024-05-31 MED ORDER — EPHEDRINE SULFATE-NACL 50-0.9 MG/10ML-% IV SOSY
PREFILLED_SYRINGE | INTRAVENOUS | Status: DC | PRN
  Administered 2024-05-31 (×5): 5 mg via INTRAVENOUS

## 2024-05-31 MED ORDER — ROCURONIUM BROMIDE 10 MG/ML (PF) SYRINGE
PREFILLED_SYRINGE | INTRAVENOUS | Status: DC | PRN
  Administered 2024-05-31: 50 mg via INTRAVENOUS

## 2024-05-31 MED ORDER — OXYCODONE HCL 5 MG PO TABS: 5.0000 mg | ORAL_TABLET | Freq: Four times a day (QID) | ORAL | 0 refills | Status: AC | PRN

## 2024-05-31 SURGICAL SUPPLY — 37 items
BAG COUNTER SPONGE SURGICOUNT (BAG) ×1 IMPLANT
BENZOIN TINCTURE PRP APPL 2/3 (GAUZE/BANDAGES/DRESSINGS) ×1 IMPLANT
CANISTER SUCTION 3000ML PPV (SUCTIONS) ×1 IMPLANT
CHLORAPREP W/TINT 26 (MISCELLANEOUS) ×1 IMPLANT
CNTNR URN SCR LID CUP LEK RST (MISCELLANEOUS) IMPLANT
COVER SURGICAL LIGHT HANDLE (MISCELLANEOUS) ×1 IMPLANT
DERMABOND ADVANCED .7 DNX12 (GAUZE/BANDAGES/DRESSINGS) IMPLANT
DRAPE LAPAROTOMY 100X72 PEDS (DRAPES) IMPLANT
DRSG TEGADERM 4X10 (GAUZE/BANDAGES/DRESSINGS) IMPLANT
DRSG TEGADERM 4X4.75 (GAUZE/BANDAGES/DRESSINGS) IMPLANT
ELECTRODE REM PT RTRN 9FT ADLT (ELECTROSURGICAL) ×1 IMPLANT
GAUZE 4X4 16PLY ~~LOC~~+RFID DBL (SPONGE) ×1 IMPLANT
GAUZE SPONGE 4X4 12PLY STRL (GAUZE/BANDAGES/DRESSINGS) IMPLANT
GLOVE BIO SURGEON STRL SZ 6 (GLOVE) ×1 IMPLANT
GLOVE INDICATOR 6.5 STRL GRN (GLOVE) ×1 IMPLANT
GOWN STRL REUS W/ TWL LRG LVL3 (GOWN DISPOSABLE) ×1 IMPLANT
GOWN STRL REUS W/ TWL XL LVL3 (GOWN DISPOSABLE) ×1 IMPLANT
KIT BASIN OR (CUSTOM PROCEDURE TRAY) ×1 IMPLANT
KIT TURNOVER KIT B (KITS) ×1 IMPLANT
MARKER SKIN DUAL TIP RULER LAB (MISCELLANEOUS) ×1 IMPLANT
NDL HYPO 25GX1X1/2 BEV (NEEDLE) ×2 IMPLANT
NEEDLE HYPO 25GX1X1/2 BEV (NEEDLE) ×1 IMPLANT
PACK GENERAL/GYN (CUSTOM PROCEDURE TRAY) ×1 IMPLANT
PAD ARMBOARD POSITIONER FOAM (MISCELLANEOUS) ×2 IMPLANT
PENCIL SMOKE EVACUATOR (MISCELLANEOUS) ×1 IMPLANT
POWDER MYRIAD MORCELLS 500MG (Miscellaneous) IMPLANT
SOLN 0.9% NACL POUR BTL 1000ML (IV SOLUTION) ×1 IMPLANT
STRIP CLOSURE SKIN 1/2X4 (GAUZE/BANDAGES/DRESSINGS) IMPLANT
STRIP CLOSURE SKIN 1/4X4 (GAUZE/BANDAGES/DRESSINGS) IMPLANT
SUT ETHILON 2 0 FS 18 (SUTURE) ×1 IMPLANT
SUT ETHILON 2 0 PSLX (SUTURE) IMPLANT
SUT MNCRL AB 4-0 PS2 18 (SUTURE) ×1 IMPLANT
SUT SILK 2 0 PERMA HAND 18 BK (SUTURE) IMPLANT
SUT VIC AB 2-0 SH 27XBRD (SUTURE) ×1 IMPLANT
SUT VIC AB 3-0 SH 27X BRD (SUTURE) ×1 IMPLANT
SYR CONTROL 10ML LL (SYRINGE) ×2 IMPLANT
TOWEL GREEN STERILE FF (TOWEL DISPOSABLE) ×1 IMPLANT

## 2024-05-31 NOTE — Transfer of Care (Signed)
 Immediate Anesthesia Transfer of Care Note  Patient: Grant Clark  Procedure(s) Performed: WIDE LOCAL EXCISION WITH ADVANCEMENT FLAP CLOSURE MELANOMA IN SITU (Left: Thigh)  Patient Location: PACU  Anesthesia Type:General  Level of Consciousness: awake, patient cooperative, and responds to stimulation  Airway & Oxygen Therapy: Patient Spontanous Breathing  Post-op Assessment: Report given to RN and Post -op Vital signs reviewed and stable  Post vital signs: Reviewed and stable  Last Vitals:  Vitals Value Taken Time  BP 151/66 05/31/24 10:53  Temp    Pulse 61 05/31/24 10:55  Resp 14 05/31/24 10:55  SpO2 92 % 05/31/24 10:55  Vitals shown include unfiled device data.  Last Pain:  Vitals:   05/31/24 0658  TempSrc:   PainSc: 0-No pain      Patients Stated Pain Goal: 0 (05/31/24 9341)  Complications: No notable events documented.

## 2024-05-31 NOTE — Op Note (Signed)
 PRE-OPERATIVE DIAGNOSIS: [id number] left anterior thigh melanoma in situ  POST-OPERATIVE DIAGNOSIS:  Same  PROCEDURE:  Procedure(s): Wide local excision wide local excision left anterior thigh melanoma in situ, advancement flap closure for defect 8.1 x 3.2 cm.   SURGEON:  Surgeon(s): Jina Nephew, MD  ANESTHESIA:   local and general  DRAINS: none   LOCAL MEDICATIONS USED:  MARCAINE    and XYLOCAINE   SPECIMEN:  Source of Specimen: wide local excision left anterior thigh melanoma in situ  FINDINGS:  scab at shave biopsy site, no gross residual disease, chronic sun damage to surrounding skin  DISPOSITION OF SPECIMEN:  PATHOLOGY  COUNTS:  YES  PLAN OF CARE: Discharge to home after PACU  PATIENT DISPOSITION:  PACU - hemodynamically stable.    PROCEDURE:   Pt was identified in the holding area, taken to the OR, and placed supine on the OR table.  General anesthesia was induced.  Time out was performed according to the surgical safety checklist.  When all was correct, we continued.     The left anterior thigh was prepped and draped in sterile fashion.  The melanoma was identified and 0.5 cm margins were marked out.  Local was administered under the melanoma and the adjacent tissue.  A #10 blade was used to incise the skin around the melanoma.  The cautery was used to take the dissection down to the fascia.  The skin was marked in situ with orientation sutures.  The cautery was used to take the specimen off the fascia, and it was passed off the table.    Skin hooks were used to elevate the edges of the incision and the skin was freed up in all directions with the cautery to form the advancement flaps.  This was pulled together in a longitudinal orientation. The skin was pulled together to check the tension.  Deep interrupted 2-0 vicryl sutures were placed to relieve tension.  The skin was then reapproximated with 3-0 interrupted vicryl deep dermal sutures and 4-0 monocryl running subcuticular  sutures.  Four 2-0 nylon horizontal mattress sutures were placed as well.    The melanoma site was cleaned, dried, and dressed with Benzoin, steristrips, gauze, and tegaderm.    Needle, sponge, and instrument counts were correct.  The patient was awakened from anesthesia and taken to the PACU in stable condition.

## 2024-05-31 NOTE — Interval H&P Note (Signed)
 History and Physical Interval Note:  05/31/2024 9:24 AM  Grant Clark  has presented today for surgery, with the diagnosis of melanoma IN SITU LEFT THIGH.  The various methods of treatment have been discussed with the patient and family. After consideration of risks, benefits and other options for treatment, the patient has consented to  Procedure(s) with comments: CREATION, FLAP, ROTATION (Left) - WIDE LOCAL EXCISION WITH ADVANCEMENT FLAP CLOSURE MELANOMA as a surgical intervention.  The patient's history has been reviewed, patient examined, no change in status, stable for surgery.  I have reviewed the patient's chart and labs.  Questions were answered to the patient's satisfaction.     Grant Clark

## 2024-05-31 NOTE — Discharge Instructions (Addendum)
 [facility] Office Phone Number [phone]   POST OP INSTRUCTIONS  Always review your discharge instruction sheet given to you by the facility where your surgery was performed.  IF YOU HAVE DISABILITY OR FAMILY LEAVE FORMS, YOU MUST BRING THEM TO THE OFFICE FOR PROCESSING.  DO NOT GIVE THEM TO YOUR DOCTOR.  Take 2 tylenol  (acetominophen) three times a day for 3 days.  If you still have pain, add ibuprofen with food in between if able to take this (if you have kidney issues or stomach issues, do not take ibuprofen).  If both of those are not enough, add the narcotic pain pill.  If you find you are needing a lot of this overnight after surgery, call the next morning for a refill.   Take your usually prescribed medications unless otherwise directed If you need a refill on your pain medication, please contact your pharmacy.  They will contact our office to request authorization.  Prescriptions will not be filled after 5pm or on week-ends. You should eat very light the first 24 hours after surgery, such as soup, crackers, pudding, etc.  Resume your normal diet the day after surgery It is common to experience some constipation if taking pain medication after surgery.  Increasing fluid intake and taking a stool softener will usually help or prevent this problem from occurring.  A mild laxative (Milk of Magnesia or Miralax) should be taken according to package directions if there are no bowel movements after 48 hours. You may shower in 48 hours.  You may remove the dressing before or after the shower whichever is preferred.  You do not have to re-dress the wound unless you desire. If so, use larger band-aid over the suture tails, or gauze, or sometimes people prefer to place a gauze over it and use an ace wrap to hold it in place if they don't want the adhesive.   ACTIVITIES:  No strenuous activity or heavy lifting for 2 weeks. You may drive when you no longer are taking prescription pain  medication, you can comfortably wear a seatbelt, and you can safely maneuver your car and apply brakes. RETURN TO WORK:  __________n/a_______________ Grant Clark should see your doctor in the office for a follow-up appointment approximately three-four weeks after your surgery.    WHEN TO CALL YOUR DOCTOR: Fever over 101.0 Nausea and/or vomiting. Extreme swelling or bruising. Continued bleeding from incision. Increased pain, redness, or drainage from the incision.  The clinic staff is available to answer your questions during regular business hours.  Please don't hesitate to call and ask to speak to one of the nurses for clinical concerns.  If you have a medical emergency, go to the nearest emergency room or call 911.  A surgeon from [facility] is always on call at the hospital.  For further questions, please visit [email or web address]          No acetaminophen /Tylenol  until after 1:00 pm today if needed.

## 2024-05-31 NOTE — Anesthesia Procedure Notes (Signed)
 Procedure Name: Intubation Date/Time: 05/31/2024 9:51 AM  Performed by: Cindie Donald CROME, CRNAPre-anesthesia Checklist: Patient identified, Emergency Drugs available, Suction available and Patient being monitored Patient Re-evaluated:Patient Re-evaluated prior to induction Oxygen Delivery Method: Circle System Utilized Preoxygenation: Pre-oxygenation with 100% oxygen Induction Type: IV induction Ventilation: Mask ventilation without difficulty Laryngoscope Size: Mac and 4 Grade View: Grade I Tube type: Oral Tube size: 7.5 mm Number of attempts: 1 Airway Equipment and Method: Stylet Placement Confirmation: ETT inserted through vocal cords under direct vision, positive ETCO2 and breath sounds checked- equal and bilateral Secured at: 23 cm Tube secured with: Tape Dental Injury: Teeth and Oropharynx as per pre-operative assessment

## 2024-05-31 NOTE — Anesthesia Postprocedure Evaluation (Signed)
 Anesthesia Post Note  Patient: Grant Clark  Procedure(s) Performed: WIDE LOCAL EXCISION WITH ADVANCEMENT FLAP CLOSURE MELANOMA IN SITU (Left: Thigh)     Patient location during evaluation: PACU Anesthesia Type: General Level of consciousness: awake and alert Pain management: pain level controlled Vital Signs Assessment: post-procedure vital signs reviewed and stable Respiratory status: spontaneous breathing, nonlabored ventilation, respiratory function stable and patient connected to nasal cannula oxygen Cardiovascular status: blood pressure returned to baseline and stable Postop Assessment: no apparent nausea or vomiting Anesthetic complications: no   No notable events documented.  Last Vitals:  Vitals:   05/31/24 1115 05/31/24 1130  BP: 120/86 (!) 149/66  Pulse: 62 60  Resp: (!) 21 16  Temp:    SpO2: 90% 93%    Last Pain:  Vitals:   05/31/24 1115  TempSrc:   PainSc: 0-No pain                 [name]

## 2024-05-31 NOTE — Anesthesia Preprocedure Evaluation (Addendum)
 Anesthesia Evaluation  Patient identified by MRN, date of birth, ID band Patient awake    Reviewed: Allergy & Precautions, NPO status , Patient's Chart, lab work & pertinent test results  Airway Mallampati: II  TM Distance: >3 FB Neck ROM: Full    Dental  (+) Teeth Intact, Dental Advisory Given, Implants   Pulmonary Current Smoker and Patient abstained from smoking.   breath sounds clear to auscultation       Cardiovascular negative cardio ROS  Rhythm:Regular Rate:Normal     Neuro/Psych negative neurological ROS  negative psych ROS   GI/Hepatic negative GI ROS, Neg liver ROS,,,  Endo/Other  negative endocrine ROS    Renal/GU negative Renal ROS     Musculoskeletal negative musculoskeletal ROS (+)    Abdominal   Peds  Hematology negative hematology ROS (+)   Anesthesia Other Findings   Reproductive/Obstetrics                              Anesthesia Physical Anesthesia Plan  ASA: 3  Anesthesia Plan: General   Post-op Pain Management: Tylenol  PO (pre-op)*   Induction: Intravenous  PONV Risk Score and Plan: 2 and Ondansetron and Treatment may vary due to age or medical condition  Airway Management Planned: Oral ETT  Additional Equipment: None  Intra-op Plan:   Post-operative Plan: Extubation in OR  Informed Consent: I have reviewed the patients History and Physical, chart, labs and discussed the procedure including the risks, benefits and alternatives for the proposed anesthesia with the patient or authorized representative who has indicated his/her understanding and acceptance.     Dental advisory given  Plan Discussed with: CRNA  Anesthesia Plan Comments:          Anesthesia Quick Evaluation

## 2024-06-01 ENCOUNTER — Encounter (HOSPITAL_COMMUNITY): Payer: Self-pay | Admitting: General Surgery

## 2024-06-02 LAB — SURGICAL PATHOLOGY

## 2024-06-03 ENCOUNTER — Ambulatory Visit: Payer: Self-pay | Admitting: General Surgery
